# Patient Record
Sex: Male | Born: 1965 | Race: White | Hispanic: No | Smoking: Current every day smoker
Health system: Southern US, Community
[De-identification: ages and names within clinical notes are randomized; demographics above are authoritative.]

## PROBLEM LIST (undated history)

## (undated) DIAGNOSIS — I38 Endocarditis, valve unspecified: Secondary | ICD-10-CM

## (undated) DIAGNOSIS — Z9289 Personal history of other medical treatment: Secondary | ICD-10-CM

## (undated) DIAGNOSIS — I714 Abdominal aortic aneurysm, without rupture, unspecified: Secondary | ICD-10-CM

## (undated) DIAGNOSIS — E119 Type 2 diabetes mellitus without complications: Secondary | ICD-10-CM

## (undated) DIAGNOSIS — J449 Chronic obstructive pulmonary disease, unspecified: Secondary | ICD-10-CM

## (undated) DIAGNOSIS — D649 Anemia, unspecified: Secondary | ICD-10-CM

## (undated) DIAGNOSIS — K259 Gastric ulcer, unspecified as acute or chronic, without hemorrhage or perforation: Secondary | ICD-10-CM

## (undated) DIAGNOSIS — Z5189 Encounter for other specified aftercare: Secondary | ICD-10-CM

## (undated) DIAGNOSIS — K859 Acute pancreatitis without necrosis or infection, unspecified: Secondary | ICD-10-CM

## (undated) DIAGNOSIS — K219 Gastro-esophageal reflux disease without esophagitis: Secondary | ICD-10-CM

## (undated) HISTORY — PX: ABDOMINAL AORTIC ANEURYSM REPAIR: SUR1152

---

## 2002-03-05 ENCOUNTER — Emergency Department (HOSPITAL_COMMUNITY): Admission: EM | Admit: 2002-03-05 | Discharge: 2002-03-05 | Payer: Self-pay | Admitting: Emergency Medicine

## 2004-09-24 ENCOUNTER — Emergency Department: Payer: Self-pay | Admitting: Emergency Medicine

## 2005-12-20 ENCOUNTER — Emergency Department (HOSPITAL_COMMUNITY): Admission: EM | Admit: 2005-12-20 | Discharge: 2005-12-20 | Payer: Self-pay | Admitting: Emergency Medicine

## 2007-12-13 ENCOUNTER — Emergency Department (HOSPITAL_COMMUNITY): Admission: EM | Admit: 2007-12-13 | Discharge: 2007-12-13 | Payer: Self-pay | Admitting: Emergency Medicine

## 2007-12-19 ENCOUNTER — Emergency Department (HOSPITAL_COMMUNITY): Admission: EM | Admit: 2007-12-19 | Discharge: 2007-12-19 | Payer: Self-pay | Admitting: Emergency Medicine

## 2008-02-21 ENCOUNTER — Inpatient Hospital Stay (HOSPITAL_COMMUNITY): Admission: EM | Admit: 2008-02-21 | Discharge: 2008-02-27 | Payer: Self-pay | Admitting: Emergency Medicine

## 2008-02-21 ENCOUNTER — Encounter: Payer: Self-pay | Admitting: Emergency Medicine

## 2008-02-21 ENCOUNTER — Encounter (INDEPENDENT_AMBULATORY_CARE_PROVIDER_SITE_OTHER): Payer: Self-pay | Admitting: Gastroenterology

## 2008-03-07 ENCOUNTER — Emergency Department (HOSPITAL_COMMUNITY): Admission: EM | Admit: 2008-03-07 | Discharge: 2008-03-08 | Payer: Self-pay | Admitting: Emergency Medicine

## 2009-05-01 ENCOUNTER — Emergency Department (HOSPITAL_COMMUNITY): Admission: EM | Admit: 2009-05-01 | Discharge: 2009-05-01 | Payer: Self-pay | Admitting: Emergency Medicine

## 2009-08-29 ENCOUNTER — Ambulatory Visit: Payer: Self-pay | Admitting: Radiology

## 2009-08-29 ENCOUNTER — Emergency Department (HOSPITAL_BASED_OUTPATIENT_CLINIC_OR_DEPARTMENT_OTHER): Admission: EM | Admit: 2009-08-29 | Discharge: 2009-08-29 | Payer: Self-pay | Admitting: Emergency Medicine

## 2009-11-13 ENCOUNTER — Encounter: Payer: Self-pay | Admitting: Emergency Medicine

## 2009-11-13 ENCOUNTER — Inpatient Hospital Stay (HOSPITAL_COMMUNITY): Admission: EM | Admit: 2009-11-13 | Discharge: 2009-11-16 | Payer: Self-pay | Admitting: Internal Medicine

## 2009-11-13 ENCOUNTER — Ambulatory Visit: Payer: Self-pay | Admitting: Diagnostic Radiology

## 2009-12-06 ENCOUNTER — Emergency Department (HOSPITAL_BASED_OUTPATIENT_CLINIC_OR_DEPARTMENT_OTHER): Admission: EM | Admit: 2009-12-06 | Discharge: 2009-12-06 | Payer: Self-pay | Admitting: Emergency Medicine

## 2009-12-06 ENCOUNTER — Ambulatory Visit: Payer: Self-pay | Admitting: Diagnostic Radiology

## 2009-12-07 ENCOUNTER — Ambulatory Visit: Payer: Self-pay | Admitting: Internal Medicine

## 2009-12-07 LAB — CONVERTED CEMR LAB
ALT: <8 units/L (ref 0–53)
AST: 9 units/L (ref 0–37)
Alkaline Phosphatase: 69 units/L (ref 39–117)
BUN: 5 mg/dL — ABNORMAL LOW (ref 6–23)
Basophils Absolute: 0.1 10*3/uL (ref 0.0–0.1)
Calcium: 8.6 mg/dL (ref 8.4–10.5)
Chloride: 95 meq/L — ABNORMAL LOW (ref 96–112)
Creatinine, Ser: 0.62 mg/dL (ref 0.40–1.50)
Eosinophils Absolute: 0.1 10*3/uL (ref 0.0–0.7)
HDL: 28 mg/dL — ABNORMAL LOW (ref >39)
LDL Cholesterol: 38 mg/dL (ref 0–99)
Lymphocytes Relative: 32 % (ref 12–46)
Lymphs Abs: 3.2 10*3/uL (ref 0.7–4.0)
MCV: 88.7 fL (ref 78.0–100.0)
Neutrophils Relative %: 56 % (ref 43–77)
Platelets: 447 10*3/uL — ABNORMAL HIGH (ref 150–400)
RDW: 15.8 % — ABNORMAL HIGH (ref 11.5–15.5)
TSH: 3.52 microintl units/mL (ref 0.350–4.500)
Total CHOL/HDL Ratio: 2.7
VLDL: 9 mg/dL (ref 0–40)
WBC: 10 10*3/uL (ref 4.0–10.5)

## 2009-12-10 HISTORY — PX: SPLENIC ARTERY EMBOLIZATION: SHX2430

## 2009-12-20 ENCOUNTER — Inpatient Hospital Stay (HOSPITAL_COMMUNITY): Admission: EM | Admit: 2009-12-20 | Discharge: 2010-01-06 | Payer: Self-pay | Admitting: Emergency Medicine

## 2009-12-20 ENCOUNTER — Encounter (INDEPENDENT_AMBULATORY_CARE_PROVIDER_SITE_OTHER): Payer: Self-pay | Admitting: Internal Medicine

## 2010-01-25 ENCOUNTER — Ambulatory Visit: Payer: Self-pay | Admitting: Internal Medicine

## 2010-01-26 ENCOUNTER — Encounter: Payer: Self-pay | Admitting: Emergency Medicine

## 2010-01-26 ENCOUNTER — Inpatient Hospital Stay (HOSPITAL_COMMUNITY): Admission: RE | Admit: 2010-01-26 | Discharge: 2010-01-28 | Payer: Self-pay | Admitting: Internal Medicine

## 2010-01-26 ENCOUNTER — Ambulatory Visit: Payer: Self-pay | Admitting: Diagnostic Radiology

## 2010-02-24 ENCOUNTER — Other Ambulatory Visit: Payer: Self-pay | Admitting: Emergency Medicine

## 2010-02-24 ENCOUNTER — Ambulatory Visit: Payer: Self-pay | Admitting: Internal Medicine

## 2010-02-25 ENCOUNTER — Inpatient Hospital Stay (HOSPITAL_COMMUNITY): Admission: EM | Admit: 2010-02-25 | Discharge: 2010-02-26 | Payer: Self-pay | Admitting: Internal Medicine

## 2010-03-28 ENCOUNTER — Ambulatory Visit: Payer: Self-pay | Admitting: Family Medicine

## 2010-03-28 ENCOUNTER — Encounter (INDEPENDENT_AMBULATORY_CARE_PROVIDER_SITE_OTHER): Payer: Self-pay | Admitting: Internal Medicine

## 2010-03-28 LAB — CONVERTED CEMR LAB: Lipase: 122 units/L — ABNORMAL HIGH (ref 0–75)

## 2010-04-10 ENCOUNTER — Emergency Department (HOSPITAL_BASED_OUTPATIENT_CLINIC_OR_DEPARTMENT_OTHER): Admission: EM | Admit: 2010-04-10 | Discharge: 2010-04-10 | Payer: Self-pay | Admitting: Emergency Medicine

## 2010-04-21 ENCOUNTER — Ambulatory Visit: Payer: Self-pay | Admitting: Internal Medicine

## 2010-05-23 ENCOUNTER — Emergency Department (HOSPITAL_BASED_OUTPATIENT_CLINIC_OR_DEPARTMENT_OTHER): Admission: EM | Admit: 2010-05-23 | Discharge: 2010-05-23 | Payer: Self-pay | Admitting: Emergency Medicine

## 2010-05-26 ENCOUNTER — Ambulatory Visit: Payer: Self-pay | Admitting: Family Medicine

## 2010-06-12 ENCOUNTER — Ambulatory Visit: Payer: Self-pay | Admitting: Interventional Radiology

## 2010-06-12 ENCOUNTER — Emergency Department (HOSPITAL_BASED_OUTPATIENT_CLINIC_OR_DEPARTMENT_OTHER): Admission: EM | Admit: 2010-06-12 | Discharge: 2010-06-12 | Payer: Self-pay | Admitting: Emergency Medicine

## 2010-07-22 ENCOUNTER — Emergency Department (HOSPITAL_BASED_OUTPATIENT_CLINIC_OR_DEPARTMENT_OTHER): Admission: EM | Admit: 2010-07-22 | Discharge: 2010-07-22 | Payer: Self-pay | Admitting: Emergency Medicine

## 2010-07-26 ENCOUNTER — Ambulatory Visit: Payer: Self-pay | Admitting: Diagnostic Radiology

## 2010-07-26 ENCOUNTER — Emergency Department (HOSPITAL_BASED_OUTPATIENT_CLINIC_OR_DEPARTMENT_OTHER): Admission: EM | Admit: 2010-07-26 | Discharge: 2010-07-26 | Payer: Self-pay | Admitting: Emergency Medicine

## 2010-08-22 ENCOUNTER — Ambulatory Visit: Payer: Self-pay | Admitting: Internal Medicine

## 2010-08-22 ENCOUNTER — Ambulatory Visit (HOSPITAL_COMMUNITY): Admission: RE | Admit: 2010-08-22 | Discharge: 2010-08-22 | Payer: Self-pay | Admitting: Family Medicine

## 2010-10-25 ENCOUNTER — Encounter (INDEPENDENT_AMBULATORY_CARE_PROVIDER_SITE_OTHER): Payer: Self-pay | Admitting: Internal Medicine

## 2010-11-06 ENCOUNTER — Emergency Department (HOSPITAL_BASED_OUTPATIENT_CLINIC_OR_DEPARTMENT_OTHER): Admission: EM | Admit: 2010-11-06 | Discharge: 2010-11-06 | Payer: Self-pay | Admitting: Emergency Medicine

## 2011-02-20 LAB — COMPREHENSIVE METABOLIC PANEL WITH GFR
ALT: 7 U/L (ref 0–53)
Calcium: 9 mg/dL (ref 8.4–10.5)
Glucose, Bld: 110 mg/dL — ABNORMAL HIGH (ref 70–99)
Sodium: 138 meq/L (ref 135–145)
Total Protein: 7.6 g/dL (ref 6.0–8.3)

## 2011-02-20 LAB — COMPREHENSIVE METABOLIC PANEL
AST: 15 U/L (ref 0–37)
Albumin: 4.3 g/dL (ref 3.5–5.2)
Alkaline Phosphatase: 103 U/L (ref 39–117)
BUN: 9 mg/dL (ref 6–23)
CO2: 25 mEq/L (ref 19–32)
Chloride: 99 mEq/L (ref 96–112)
Creatinine, Ser: 0.6 mg/dL (ref 0.4–1.5)
GFR calc non Af Amer: >60 mL/min (ref >60)
Potassium: 4 mEq/L (ref 3.5–5.1)
Total Bilirubin: 0.9 mg/dL (ref 0.3–1.2)

## 2011-02-20 LAB — DIFFERENTIAL
Basophils Absolute: 0.6 10*3/uL — ABNORMAL HIGH (ref 0.0–0.1)
Basophils Relative: 3 % — ABNORMAL HIGH (ref 0–1)
Eosinophils Absolute: 0.1 10*3/uL (ref 0.0–0.7)
Eosinophils Relative: 0 % (ref 0–5)
Lymphocytes Relative: 18 % (ref 12–46)
Lymphs Abs: 3.6 K/uL (ref 0.7–4.0)
Monocytes Absolute: 1.7 10*3/uL — ABNORMAL HIGH (ref 0.1–1.0)
Monocytes Relative: 8 % (ref 3–12)
Neutro Abs: 14 10*3/uL — ABNORMAL HIGH (ref 1.7–7.7)
Neutrophils Relative %: 71 % (ref 43–77)

## 2011-02-20 LAB — URINALYSIS, ROUTINE W REFLEX MICROSCOPIC
Glucose, UA: NEGATIVE mg/dL
Hgb urine dipstick: NEGATIVE
Specific Gravity, Urine: 1.043 — ABNORMAL HIGH (ref 1.005–1.030)
pH: 5 (ref 5.0–8.0)

## 2011-02-20 LAB — LACTATE DEHYDROGENASE: LDH: 168 U/L (ref 94–250)

## 2011-02-20 LAB — CBC
HCT: 33.4 % — ABNORMAL LOW (ref 39.0–52.0)
Hemoglobin: 11.2 g/dL — ABNORMAL LOW (ref 13.0–17.0)
MCH: 25.9 pg — ABNORMAL LOW (ref 26.0–34.0)
MCHC: 33.6 g/dL (ref 30.0–36.0)
MCV: 77 fL — ABNORMAL LOW (ref 78.0–100.0)
Platelets: 626 K/uL — ABNORMAL HIGH (ref 150–400)
RBC: 4.34 MIL/uL (ref 4.22–5.81)
RDW: 17.4 % — ABNORMAL HIGH (ref 11.5–15.5)
WBC: 20 10*3/uL — ABNORMAL HIGH (ref 4.0–10.5)

## 2011-02-20 LAB — LIPASE, BLOOD: Lipase: 902 U/L — ABNORMAL HIGH (ref 23–300)

## 2011-02-23 LAB — CBC
HCT: 30 % — ABNORMAL LOW (ref 39.0–52.0)
MCHC: 32.6 g/dL (ref 30.0–36.0)
MCV: 83.9 fL (ref 78.0–100.0)
Platelets: 582 10*3/uL — ABNORMAL HIGH (ref 150–400)
RDW: 16.9 % — ABNORMAL HIGH (ref 11.5–15.5)

## 2011-02-23 LAB — URINALYSIS, ROUTINE W REFLEX MICROSCOPIC
Glucose, UA: NEGATIVE mg/dL
Protein, ur: NEGATIVE mg/dL
Specific Gravity, Urine: 1.039 — ABNORMAL HIGH (ref 1.005–1.030)

## 2011-02-23 LAB — DIFFERENTIAL
Basophils Relative: 1 % (ref 0–1)
Lymphocytes Relative: 19 % (ref 12–46)
Lymphs Abs: 1.8 10*3/uL (ref 0.7–4.0)
Monocytes Relative: 4 % (ref 3–12)
Neutro Abs: 7.5 10*3/uL (ref 1.7–7.7)
Neutrophils Relative %: 76 % (ref 43–77)

## 2011-02-23 LAB — COMPREHENSIVE METABOLIC PANEL
AST: 10 U/L (ref 0–37)
Albumin: 3 g/dL — ABNORMAL LOW (ref 3.5–5.2)
BUN: 5 mg/dL — ABNORMAL LOW (ref 6–23)
Calcium: 8.5 mg/dL (ref 8.4–10.5)
Creatinine, Ser: 0.5 mg/dL (ref 0.4–1.5)
Total Protein: 6.4 g/dL (ref 6.0–8.3)

## 2011-02-25 LAB — COMPREHENSIVE METABOLIC PANEL
ALT: 8 U/L (ref 0–53)
ALT: <8 U/L (ref 0–53)
AST: 13 U/L (ref 0–37)
AST: 15 U/L (ref 0–37)
AST: 11 U/L (ref 0–37)
AST: 12 U/L (ref 0–37)
Albumin: 2.2 g/dL — ABNORMAL LOW (ref 3.5–5.2)
Albumin: 3.9 g/dL (ref 3.5–5.2)
Albumin: 2.9 g/dL — ABNORMAL LOW (ref 3.5–5.2)
Albumin: 2.9 g/dL — ABNORMAL LOW (ref 3.5–5.2)
Alkaline Phosphatase: 86 U/L (ref 39–117)
Alkaline Phosphatase: 92 U/L (ref 39–117)
BUN: 1 mg/dL — ABNORMAL LOW (ref 6–23)
BUN: 1 mg/dL — ABNORMAL LOW (ref 6–23)
CO2: 28 mEq/L (ref 19–32)
CO2: 28 mEq/L (ref 19–32)
Calcium: 8 mg/dL — ABNORMAL LOW (ref 8.4–10.5)
Calcium: 8.7 mg/dL (ref 8.4–10.5)
Calcium: 8.8 mg/dL (ref 8.4–10.5)
Chloride: 102 mEq/L (ref 96–112)
Chloride: 93 mEq/L — ABNORMAL LOW (ref 96–112)
Chloride: 95 mEq/L — ABNORMAL LOW (ref 96–112)
Creatinine, Ser: 0.47 mg/dL (ref 0.4–1.5)
Creatinine, Ser: 0.6 mg/dL (ref 0.4–1.5)
Creatinine, Ser: 0.53 mg/dL (ref 0.4–1.5)
Creatinine, Ser: 0.56 mg/dL (ref 0.4–1.5)
GFR calc Af Amer: >60 mL/min (ref >60)
GFR calc Af Amer: >60 mL/min (ref >60)
GFR calc Af Amer: >60 mL/min (ref >60)
GFR calc Af Amer: >60 mL/min (ref >60)
GFR calc non Af Amer: >60 mL/min (ref >60)
GFR calc non Af Amer: >60 mL/min (ref >60)
Glucose, Bld: 82 mg/dL (ref 70–99)
Glucose, Bld: 91 mg/dL (ref 70–99)
Potassium: 4.3 mEq/L (ref 3.5–5.1)
Potassium: 3.6 mEq/L (ref 3.5–5.1)
Potassium: 4.6 mEq/L (ref 3.5–5.1)
Sodium: 130 mEq/L — ABNORMAL LOW (ref 135–145)
Sodium: 133 mEq/L — ABNORMAL LOW (ref 135–145)
Total Bilirubin: 0.8 mg/dL (ref 0.3–1.2)
Total Bilirubin: 0.4 mg/dL (ref 0.3–1.2)
Total Bilirubin: 0.6 mg/dL (ref 0.3–1.2)
Total Protein: 7.6 g/dL (ref 6.0–8.3)
Total Protein: 6 g/dL (ref 6.0–8.3)
Total Protein: 6.2 g/dL (ref 6.0–8.3)

## 2011-02-25 LAB — CBC
HCT: 27.6 % — ABNORMAL LOW (ref 39.0–52.0)
HCT: 27.9 % — ABNORMAL LOW (ref 39.0–52.0)
HCT: 28.3 % — ABNORMAL LOW (ref 39.0–52.0)
HCT: 28.7 % — ABNORMAL LOW (ref 39.0–52.0)
HCT: 29.5 % — ABNORMAL LOW (ref 39.0–52.0)
HCT: 29.8 % — ABNORMAL LOW (ref 39.0–52.0)
Hemoglobin: 9.4 g/dL — ABNORMAL LOW (ref 13.0–17.0)
Hemoglobin: 10.2 g/dL — ABNORMAL LOW (ref 13.0–17.0)
Hemoglobin: 10.4 g/dL — ABNORMAL LOW (ref 13.0–17.0)
Hemoglobin: 9.5 g/dL — ABNORMAL LOW (ref 13.0–17.0)
Hemoglobin: 9.7 g/dL — ABNORMAL LOW (ref 13.0–17.0)
Hemoglobin: 9.7 g/dL — ABNORMAL LOW (ref 13.0–17.0)
MCHC: 33.5 g/dL (ref 30.0–36.0)
MCHC: 33.9 g/dL (ref 30.0–36.0)
MCHC: 32.9 g/dL (ref 30.0–36.0)
MCHC: 33.4 g/dL (ref 30.0–36.0)
MCHC: 33.6 g/dL (ref 30.0–36.0)
MCHC: 33.8 g/dL (ref 30.0–36.0)
MCHC: 34.1 g/dL (ref 30.0–36.0)
MCV: 89.7 fL (ref 78.0–100.0)
MCV: 87.1 fL (ref 78.0–100.0)
MCV: 87.9 fL (ref 78.0–100.0)
MCV: 88 fL (ref 78.0–100.0)
MCV: 88.8 fL (ref 78.0–100.0)
Platelets: 482 10*3/uL — ABNORMAL HIGH (ref 150–400)
Platelets: 574 10*3/uL — ABNORMAL HIGH (ref 150–400)
Platelets: 394 10*3/uL (ref 150–400)
Platelets: 413 10*3/uL — ABNORMAL HIGH (ref 150–400)
Platelets: 429 10*3/uL — ABNORMAL HIGH (ref 150–400)
Platelets: 438 10*3/uL — ABNORMAL HIGH (ref 150–400)
RBC: 3.64 MIL/uL — ABNORMAL LOW (ref 4.22–5.81)
RBC: 4.13 MIL/uL — ABNORMAL LOW (ref 4.22–5.81)
RBC: 3.24 MIL/uL — ABNORMAL LOW (ref 4.22–5.81)
RBC: 3.27 MIL/uL — ABNORMAL LOW (ref 4.22–5.81)
RBC: 3.32 MIL/uL — ABNORMAL LOW (ref 4.22–5.81)
RBC: 3.39 MIL/uL — ABNORMAL LOW (ref 4.22–5.81)
RBC: 3.54 MIL/uL — ABNORMAL LOW (ref 4.22–5.81)
RDW: 16.3 % — ABNORMAL HIGH (ref 11.5–15.5)
RDW: 16.5 % — ABNORMAL HIGH (ref 11.5–15.5)
RDW: 19.6 % — ABNORMAL HIGH (ref 11.5–15.5)
RDW: 15.9 % — ABNORMAL HIGH (ref 11.5–15.5)
RDW: 16.1 % — ABNORMAL HIGH (ref 11.5–15.5)
RDW: 16.4 % — ABNORMAL HIGH (ref 11.5–15.5)
WBC: 11.1 10*3/uL — ABNORMAL HIGH (ref 4.0–10.5)
WBC: 8.1 10*3/uL (ref 4.0–10.5)
WBC: 10.1 10*3/uL (ref 4.0–10.5)
WBC: 7.9 10*3/uL (ref 4.0–10.5)
WBC: 8.3 10*3/uL (ref 4.0–10.5)
WBC: 8.3 10*3/uL (ref 4.0–10.5)
WBC: 8.5 10*3/uL (ref 4.0–10.5)
WBC: 9.6 10*3/uL (ref 4.0–10.5)

## 2011-02-25 LAB — BASIC METABOLIC PANEL
BUN: 1 mg/dL — ABNORMAL LOW (ref 6–23)
BUN: <1 mg/dL — ABNORMAL LOW (ref 6–23)
BUN: 2 mg/dL — ABNORMAL LOW (ref 6–23)
BUN: 2 mg/dL — ABNORMAL LOW (ref 6–23)
BUN: <1 mg/dL — ABNORMAL LOW (ref 6–23)
BUN: <1 mg/dL — ABNORMAL LOW (ref 6–23)
CO2: 27 mEq/L (ref 19–32)
CO2: 29 mEq/L (ref 19–32)
CO2: 30 mEq/L (ref 19–32)
CO2: 31 mEq/L (ref 19–32)
CO2: 32 mEq/L (ref 19–32)
Calcium: 8.2 mg/dL — ABNORMAL LOW (ref 8.4–10.5)
Calcium: 8.7 mg/dL (ref 8.4–10.5)
Calcium: 8.8 mg/dL (ref 8.4–10.5)
Calcium: 9 mg/dL (ref 8.4–10.5)
Calcium: 9.1 mg/dL (ref 8.4–10.5)
Calcium: 9.1 mg/dL (ref 8.4–10.5)
Chloride: 103 mEq/L (ref 96–112)
Chloride: 99 mEq/L (ref 96–112)
Chloride: 96 mEq/L (ref 96–112)
Chloride: 96 mEq/L (ref 96–112)
Chloride: 98 mEq/L (ref 96–112)
Chloride: 99 mEq/L (ref 96–112)
Creatinine, Ser: 0.47 mg/dL (ref 0.4–1.5)
Creatinine, Ser: 0.5 mg/dL (ref 0.4–1.5)
Creatinine, Ser: 0.53 mg/dL (ref 0.4–1.5)
Creatinine, Ser: 0.63 mg/dL (ref 0.4–1.5)
Creatinine, Ser: 0.68 mg/dL (ref 0.4–1.5)
GFR calc Af Amer: >60 mL/min (ref >60)
GFR calc Af Amer: >60 mL/min (ref >60)
GFR calc Af Amer: >60 mL/min (ref >60)
GFR calc Af Amer: >60 mL/min (ref >60)
GFR calc non Af Amer: >60 mL/min (ref >60)
GFR calc non Af Amer: >60 mL/min (ref >60)
GFR calc non Af Amer: >60 mL/min (ref >60)
GFR calc non Af Amer: >60 mL/min (ref >60)
GFR calc non Af Amer: >60 mL/min (ref >60)
GFR calc non Af Amer: >60 mL/min (ref >60)
Glucose, Bld: 79 mg/dL (ref 70–99)
Glucose, Bld: 88 mg/dL (ref 70–99)
Glucose, Bld: 91 mg/dL (ref 70–99)
Glucose, Bld: 75 mg/dL (ref 70–99)
Glucose, Bld: 87 mg/dL (ref 70–99)
Glucose, Bld: 93 mg/dL (ref 70–99)
Potassium: 3.5 mEq/L (ref 3.5–5.1)
Potassium: 3.7 mEq/L (ref 3.5–5.1)
Potassium: 4 mEq/L (ref 3.5–5.1)
Potassium: 4.3 mEq/L (ref 3.5–5.1)
Potassium: 4.6 mEq/L (ref 3.5–5.1)
Sodium: 134 mEq/L — ABNORMAL LOW (ref 135–145)
Sodium: 135 mEq/L (ref 135–145)
Sodium: 136 mEq/L (ref 135–145)
Sodium: 137 mEq/L (ref 135–145)
Sodium: 137 mEq/L (ref 135–145)

## 2011-02-25 LAB — HEMOGLOBIN AND HEMATOCRIT, BLOOD
HCT: 26.9 % — ABNORMAL LOW (ref 39.0–52.0)
HCT: 27.1 % — ABNORMAL LOW (ref 39.0–52.0)
HCT: 27.7 % — ABNORMAL LOW (ref 39.0–52.0)
HCT: 28.2 % — ABNORMAL LOW (ref 39.0–52.0)
HCT: 30.4 % — ABNORMAL LOW (ref 39.0–52.0)
HCT: 31 % — ABNORMAL LOW (ref 39.0–52.0)
HCT: 30.1 % — ABNORMAL LOW (ref 39.0–52.0)
HCT: 30.6 % — ABNORMAL LOW (ref 39.0–52.0)
HCT: 30.8 % — ABNORMAL LOW (ref 39.0–52.0)
HCT: 31.2 % — ABNORMAL LOW (ref 39.0–52.0)
HCT: 31.7 % — ABNORMAL LOW (ref 39.0–52.0)
Hemoglobin: 8.7 g/dL — ABNORMAL LOW (ref 13.0–17.0)
Hemoglobin: 8.8 g/dL — ABNORMAL LOW (ref 13.0–17.0)
Hemoglobin: 9 g/dL — ABNORMAL LOW (ref 13.0–17.0)
Hemoglobin: 9.3 g/dL — ABNORMAL LOW (ref 13.0–17.0)
Hemoglobin: 9.5 g/dL — ABNORMAL LOW (ref 13.0–17.0)
Hemoglobin: 10.1 g/dL — ABNORMAL LOW (ref 13.0–17.0)
Hemoglobin: 10.2 g/dL — ABNORMAL LOW (ref 13.0–17.0)
Hemoglobin: 10.4 g/dL — ABNORMAL LOW (ref 13.0–17.0)
Hemoglobin: 10.4 g/dL — ABNORMAL LOW (ref 13.0–17.0)
Hemoglobin: 10.4 g/dL — ABNORMAL LOW (ref 13.0–17.0)

## 2011-02-25 LAB — LIPASE, BLOOD
Lipase: 128 U/L — ABNORMAL HIGH (ref 11–59)
Lipase: 84 U/L — ABNORMAL HIGH (ref 11–59)
Lipase: 134 U/L — ABNORMAL HIGH (ref 11–59)
Lipase: 21 U/L (ref 11–59)
Lipase: 248 U/L — ABNORMAL HIGH (ref 11–59)
Lipase: 93 U/L — ABNORMAL HIGH (ref 11–59)

## 2011-02-25 LAB — GLUCOSE, CAPILLARY
Glucose-Capillary: 108 mg/dL — ABNORMAL HIGH (ref 70–99)
Glucose-Capillary: 108 mg/dL — ABNORMAL HIGH (ref 70–99)
Glucose-Capillary: 127 mg/dL — ABNORMAL HIGH (ref 70–99)
Glucose-Capillary: 161 mg/dL — ABNORMAL HIGH (ref 70–99)

## 2011-02-25 LAB — DIFFERENTIAL
Basophils Absolute: 0.1 10*3/uL (ref 0.0–0.1)
Basophils Absolute: 0.1 10*3/uL (ref 0.0–0.1)
Basophils Relative: 1 % (ref 0–1)
Eosinophils Absolute: 0.2 10*3/uL (ref 0.0–0.7)
Eosinophils Relative: 0 % (ref 0–5)
Eosinophils Relative: 1 % (ref 0–5)
Eosinophils Relative: 2 % (ref 0–5)
Lymphocytes Relative: 22 % (ref 12–46)
Lymphocytes Relative: 30 % (ref 12–46)
Lymphs Abs: 1.9 10*3/uL (ref 0.7–4.0)
Monocytes Absolute: 0.6 10*3/uL (ref 0.1–1.0)
Monocytes Absolute: 0.8 10*3/uL (ref 0.1–1.0)
Monocytes Absolute: 1.1 10*3/uL — ABNORMAL HIGH (ref 0.1–1.0)
Monocytes Relative: 7 % (ref 3–12)
Monocytes Relative: 8 % (ref 3–12)
Neutro Abs: 7.7 10*3/uL (ref 1.7–7.7)

## 2011-02-25 LAB — HEPATIC FUNCTION PANEL
AST: 10 U/L (ref 0–37)
Bilirubin, Direct: 0.2 mg/dL (ref 0.0–0.3)
Indirect Bilirubin: 0.1 mg/dL — ABNORMAL LOW (ref 0.3–0.9)
Total Bilirubin: 0.3 mg/dL (ref 0.3–1.2)

## 2011-02-25 LAB — POCT I-STAT, CHEM 8
Calcium, Ion: 1.07 mmol/L — ABNORMAL LOW (ref 1.12–1.32)
Chloride: 97 mEq/L (ref 96–112)
Glucose, Bld: 104 mg/dL — ABNORMAL HIGH (ref 70–99)
HCT: 36 % — ABNORMAL LOW (ref 39.0–52.0)
Hemoglobin: 12.2 g/dL — ABNORMAL LOW (ref 13.0–17.0)
TCO2: 26 mmol/L (ref 0–100)

## 2011-02-25 LAB — URINALYSIS, ROUTINE W REFLEX MICROSCOPIC
Bilirubin Urine: NEGATIVE
Glucose, UA: NEGATIVE mg/dL
Hgb urine dipstick: NEGATIVE
Specific Gravity, Urine: 1.025 (ref 1.005–1.030)
Urobilinogen, UA: 0.2 mg/dL (ref 0.0–1.0)
pH: 5 (ref 5.0–8.0)

## 2011-02-25 LAB — LACTATE DEHYDROGENASE: LDH: 79 U/L — ABNORMAL LOW (ref 94–250)

## 2011-02-25 LAB — SAMPLE TO BLOOD BANK

## 2011-02-25 LAB — FERRITIN: Ferritin: 38 ng/mL (ref 22–322)

## 2011-02-25 LAB — AMYLASE: Amylase: 302 U/L — ABNORMAL HIGH (ref 0–105)

## 2011-02-25 LAB — TYPE AND SCREEN: Antibody Screen: NEGATIVE

## 2011-02-26 LAB — COMPREHENSIVE METABOLIC PANEL
AST: 69 U/L — ABNORMAL HIGH (ref 0–37)
Albumin: 4 g/dL (ref 3.5–5.2)
BUN: 9 mg/dL (ref 6–23)
Calcium: 9 mg/dL (ref 8.4–10.5)
Creatinine, Ser: 0.6 mg/dL (ref 0.4–1.5)
GFR calc Af Amer: >60 mL/min (ref >60)
GFR calc non Af Amer: >60 mL/min (ref >60)
Total Bilirubin: 1.1 mg/dL (ref 0.3–1.2)

## 2011-02-26 LAB — DIFFERENTIAL
Basophils Absolute: 0.6 10*3/uL — ABNORMAL HIGH (ref 0.0–0.1)
Eosinophils Relative: 0 % (ref 0–5)
Lymphocytes Relative: 11 % — ABNORMAL LOW (ref 12–46)
Lymphs Abs: 1.4 10*3/uL (ref 0.7–4.0)
Monocytes Absolute: 0.8 10*3/uL (ref 0.1–1.0)
Neutro Abs: 10.9 10*3/uL — ABNORMAL HIGH (ref 1.7–7.7)

## 2011-02-26 LAB — CBC
HCT: 36.9 % — ABNORMAL LOW (ref 39.0–52.0)
MCHC: 32.6 g/dL (ref 30.0–36.0)
MCV: 81.4 fL (ref 78.0–100.0)
Platelets: 514 10*3/uL — ABNORMAL HIGH (ref 150–400)

## 2011-02-26 LAB — URINALYSIS, ROUTINE W REFLEX MICROSCOPIC
Bilirubin Urine: NEGATIVE
Nitrite: NEGATIVE
Specific Gravity, Urine: 1.031 — ABNORMAL HIGH (ref 1.005–1.030)
Urobilinogen, UA: 0.2 mg/dL (ref 0.0–1.0)
pH: 5.5 (ref 5.0–8.0)

## 2011-02-26 LAB — LIPASE, BLOOD: Lipase: 2434 U/L — ABNORMAL HIGH (ref 23–300)

## 2011-02-27 LAB — CBC
Hemoglobin: 11.4 g/dL — ABNORMAL LOW (ref 13.0–17.0)
RBC: 4.1 MIL/uL — ABNORMAL LOW (ref 4.22–5.81)
WBC: 15.1 10*3/uL — ABNORMAL HIGH (ref 4.0–10.5)

## 2011-02-27 LAB — COMPREHENSIVE METABOLIC PANEL
ALT: 5 U/L (ref 0–53)
AST: 13 U/L (ref 0–37)
Alkaline Phosphatase: 120 U/L — ABNORMAL HIGH (ref 39–117)
CO2: 26 mEq/L (ref 19–32)
Chloride: 99 mEq/L (ref 96–112)
GFR calc Af Amer: >60 mL/min (ref >60)
GFR calc non Af Amer: >60 mL/min (ref >60)
Glucose, Bld: 120 mg/dL — ABNORMAL HIGH (ref 70–99)
Potassium: 4.1 mEq/L (ref 3.5–5.1)
Sodium: 136 mEq/L (ref 135–145)
Total Bilirubin: 0.5 mg/dL (ref 0.3–1.2)

## 2011-02-27 LAB — LIPASE, BLOOD: Lipase: 1097 U/L — ABNORMAL HIGH (ref 23–300)

## 2011-02-28 LAB — COMPREHENSIVE METABOLIC PANEL
ALT: 8 U/L (ref 0–53)
AST: 19 U/L (ref 0–37)
Albumin: 4 g/dL (ref 3.5–5.2)
Alkaline Phosphatase: 67 U/L (ref 39–117)
Alkaline Phosphatase: 94 U/L (ref 39–117)
BUN: 1 mg/dL — ABNORMAL LOW (ref 6–23)
BUN: 9 mg/dL (ref 6–23)
Calcium: 7.9 mg/dL — ABNORMAL LOW (ref 8.4–10.5)
Chloride: 103 mEq/L (ref 96–112)
GFR calc Af Amer: >60 mL/min (ref >60)
Glucose, Bld: 88 mg/dL (ref 70–99)
Potassium: 4.5 mEq/L (ref 3.5–5.1)
Total Bilirubin: 0.5 mg/dL (ref 0.3–1.2)
Total Protein: 4.8 g/dL — ABNORMAL LOW (ref 6.0–8.3)

## 2011-02-28 LAB — GLUCOSE, CAPILLARY
Glucose-Capillary: 168 mg/dL — ABNORMAL HIGH (ref 70–99)
Glucose-Capillary: 73 mg/dL (ref 70–99)
Glucose-Capillary: 85 mg/dL (ref 70–99)

## 2011-02-28 LAB — URINE CULTURE: Culture: NO GROWTH

## 2011-02-28 LAB — CBC
HCT: 26.6 % — ABNORMAL LOW (ref 39.0–52.0)
HCT: 29.1 % — ABNORMAL LOW (ref 39.0–52.0)
HCT: 32.9 % — ABNORMAL LOW (ref 39.0–52.0)
Hemoglobin: 8.9 g/dL — ABNORMAL LOW (ref 13.0–17.0)
MCHC: 33.3 g/dL (ref 30.0–36.0)
MCHC: 33.3 g/dL (ref 30.0–36.0)
MCV: 86.4 fL (ref 78.0–100.0)
MCV: 88.2 fL (ref 78.0–100.0)
Platelets: 424 10*3/uL — ABNORMAL HIGH (ref 150–400)
Platelets: 534 10*3/uL — ABNORMAL HIGH (ref 150–400)
RDW: 18 % — ABNORMAL HIGH (ref 11.5–15.5)
WBC: 10 10*3/uL (ref 4.0–10.5)
WBC: 19.3 10*3/uL — ABNORMAL HIGH (ref 4.0–10.5)

## 2011-02-28 LAB — URINE DRUGS OF ABUSE SCREEN W ALC, ROUTINE (REF LAB)
Benzodiazepines.: NEGATIVE
Creatinine,U: 58.4 mg/dL
Ethyl Alcohol: <10 mg/dL (ref <10)
Marijuana Metabolite: NEGATIVE
Opiate Screen, Urine: NEGATIVE
Phencyclidine (PCP): NEGATIVE

## 2011-02-28 LAB — DIFFERENTIAL
Basophils Absolute: 0.1 10*3/uL (ref 0.0–0.1)
Basophils Relative: 1 % (ref 0–1)
Eosinophils Absolute: 0.1 10*3/uL (ref 0.0–0.7)
Eosinophils Relative: 0 % (ref 0–5)
Monocytes Absolute: 1.6 10*3/uL — ABNORMAL HIGH (ref 0.1–1.0)

## 2011-02-28 LAB — CULTURE, BLOOD (ROUTINE X 2)

## 2011-02-28 LAB — URINALYSIS, ROUTINE W REFLEX MICROSCOPIC
Bilirubin Urine: NEGATIVE
Ketones, ur: NEGATIVE mg/dL
Nitrite: NEGATIVE
Urobilinogen, UA: 0.2 mg/dL (ref 0.0–1.0)

## 2011-02-28 LAB — LIPASE, BLOOD: Lipase: 68 U/L — ABNORMAL HIGH (ref 11–59)

## 2011-03-04 LAB — CBC
HCT: 27.1 % — ABNORMAL LOW (ref 39.0–52.0)
Hemoglobin: 9 g/dL — ABNORMAL LOW (ref 13.0–17.0)
MCHC: 33.1 g/dL (ref 30.0–36.0)
MCV: 85.2 fL (ref 78.0–100.0)
Platelets: 287 10*3/uL (ref 150–400)
RBC: 3.18 MIL/uL — ABNORMAL LOW (ref 4.22–5.81)
RDW: 17.3 % — ABNORMAL HIGH (ref 11.5–15.5)
WBC: 9.5 10*3/uL (ref 4.0–10.5)

## 2011-03-04 LAB — DIFFERENTIAL
Basophils Absolute: 0 10*3/uL (ref 0.0–0.1)
Basophils Relative: 1 % (ref 0–1)
Eosinophils Absolute: 0 10*3/uL (ref 0.0–0.7)
Eosinophils Relative: 1 % (ref 0–5)
Lymphocytes Relative: 28 % (ref 12–46)
Lymphs Abs: 2.6 10*3/uL (ref 0.7–4.0)
Monocytes Absolute: 0.9 10*3/uL (ref 0.1–1.0)
Monocytes Relative: 9 % (ref 3–12)
Neutro Abs: 5.9 10*3/uL (ref 1.7–7.7)
Neutrophils Relative %: 62 % (ref 43–77)

## 2011-03-04 LAB — COMPREHENSIVE METABOLIC PANEL
Albumin: 2.8 g/dL — ABNORMAL LOW (ref 3.5–5.2)
BUN: 7 mg/dL (ref 6–23)
Chloride: 102 mEq/L (ref 96–112)
Creatinine, Ser: 0.52 mg/dL (ref 0.4–1.5)
Glucose, Bld: 86 mg/dL (ref 70–99)
Total Bilirubin: 0.5 mg/dL (ref 0.3–1.2)
Total Protein: 5.5 g/dL — ABNORMAL LOW (ref 6.0–8.3)

## 2011-03-04 LAB — LIPASE, BLOOD
Lipase: 358 U/L — ABNORMAL HIGH (ref 11–59)
Lipase: 506 U/L — ABNORMAL HIGH (ref 11–59)

## 2011-03-04 LAB — AMYLASE: Amylase: 495 U/L — ABNORMAL HIGH (ref 0–105)

## 2011-03-05 LAB — COMPREHENSIVE METABOLIC PANEL
ALT: 7 U/L (ref 0–53)
AST: 16 U/L (ref 0–37)
Albumin: 3.7 g/dL (ref 3.5–5.2)
Alkaline Phosphatase: 89 U/L (ref 39–117)
BUN: 8 mg/dL (ref 6–23)
Chloride: 105 mEq/L (ref 96–112)
GFR calc Af Amer: >60 mL/min (ref >60)
Potassium: 3.4 mEq/L — ABNORMAL LOW (ref 3.5–5.1)
Sodium: 140 mEq/L (ref 135–145)
Total Bilirubin: 0.4 mg/dL (ref 0.3–1.2)
Total Protein: 7.1 g/dL (ref 6.0–8.3)

## 2011-03-05 LAB — DIFFERENTIAL
Basophils Absolute: 0.1 10*3/uL (ref 0.0–0.1)
Basophils Relative: 1 % (ref 0–1)
Eosinophils Absolute: 0 10*3/uL (ref 0.0–0.7)
Eosinophils Relative: 0 % (ref 0–5)
Monocytes Absolute: 1 10*3/uL (ref 0.1–1.0)
Monocytes Relative: 9 % (ref 3–12)

## 2011-03-05 LAB — CBC
HCT: 30.2 % — ABNORMAL LOW (ref 39.0–52.0)
Platelets: 355 10*3/uL (ref 150–400)
RDW: 15.9 % — ABNORMAL HIGH (ref 11.5–15.5)
WBC: 12.1 10*3/uL — ABNORMAL HIGH (ref 4.0–10.5)

## 2011-03-05 LAB — URINALYSIS, ROUTINE W REFLEX MICROSCOPIC
Bilirubin Urine: NEGATIVE
Glucose, UA: NEGATIVE mg/dL
Ketones, ur: 15 mg/dL — AB
Nitrite: NEGATIVE
Specific Gravity, Urine: 1.035 — ABNORMAL HIGH (ref 1.005–1.030)
pH: 5.5 (ref 5.0–8.0)

## 2011-03-12 LAB — LIPASE, BLOOD: Lipase: 2034 U/L — ABNORMAL HIGH (ref 11–59)

## 2011-03-12 LAB — CBC
HCT: 34.5 % — ABNORMAL LOW (ref 39.0–52.0)
Hemoglobin: 11.8 g/dL — ABNORMAL LOW (ref 13.0–17.0)
MCHC: 34.3 g/dL (ref 30.0–36.0)
MCV: 89.9 fL (ref 78.0–100.0)
Platelets: 401 K/uL — ABNORMAL HIGH (ref 150–400)
RBC: 3.84 MIL/uL — ABNORMAL LOW (ref 4.22–5.81)
RDW: 15.5 % (ref 11.5–15.5)
WBC: 14 K/uL — ABNORMAL HIGH (ref 4.0–10.5)

## 2011-03-12 LAB — COMPREHENSIVE METABOLIC PANEL
ALT: 13 U/L (ref 0–53)
Alkaline Phosphatase: 87 U/L (ref 39–117)
BUN: 9 mg/dL (ref 6–23)
CO2: 26 mEq/L (ref 19–32)
GFR calc non Af Amer: >60 mL/min (ref >60)
Glucose, Bld: 97 mg/dL (ref 70–99)
Potassium: 4.5 mEq/L (ref 3.5–5.1)
Sodium: 136 mEq/L (ref 135–145)
Total Protein: 6.8 g/dL (ref 6.0–8.3)

## 2011-03-12 LAB — COMPREHENSIVE METABOLIC PANEL WITH GFR
AST: 12 U/L (ref 0–37)
Albumin: 3.7 g/dL (ref 3.5–5.2)
Calcium: 9 mg/dL (ref 8.4–10.5)
Chloride: 99 meq/L (ref 96–112)
Creatinine, Ser: 0.6 mg/dL (ref 0.4–1.5)
GFR calc Af Amer: >60 mL/min (ref >60)
Total Bilirubin: 0.4 mg/dL (ref 0.3–1.2)

## 2011-03-12 LAB — DIFFERENTIAL
Basophils Absolute: 0.2 10*3/uL — ABNORMAL HIGH (ref 0.0–0.1)
Basophils Relative: 1 % (ref 0–1)
Eosinophils Absolute: 0.2 10*3/uL (ref 0.0–0.7)
Eosinophils Relative: 1 % (ref 0–5)
Lymphocytes Relative: 19 % (ref 12–46)
Lymphs Abs: 2.7 K/uL (ref 0.7–4.0)
Monocytes Absolute: 0.9 K/uL (ref 0.1–1.0)
Monocytes Relative: 6 % (ref 3–12)
Neutro Abs: 10 10*3/uL — ABNORMAL HIGH (ref 1.7–7.7)
Neutrophils Relative %: 72 % (ref 43–77)

## 2011-03-12 LAB — PROTIME-INR
INR: 1.04 (ref 0.00–1.49)
Prothrombin Time: 13.5 seconds (ref 11.6–15.2)

## 2011-03-12 LAB — APTT: aPTT: 30 s (ref 24–37)

## 2011-03-13 LAB — CBC
HCT: 29.3 % — ABNORMAL LOW (ref 39.0–52.0)
Hemoglobin: 9.1 g/dL — ABNORMAL LOW (ref 13.0–17.0)
Hemoglobin: 9.5 g/dL — ABNORMAL LOW (ref 13.0–17.0)
Hemoglobin: 12 g/dL — ABNORMAL LOW (ref 13.0–17.0)
MCHC: 32.6 g/dL (ref 30.0–36.0)
MCHC: 33.1 g/dL (ref 30.0–36.0)
MCV: 91.7 fL (ref 78.0–100.0)
MCV: 91.8 fL (ref 78.0–100.0)
Platelets: 553 10*3/uL — ABNORMAL HIGH (ref 150–400)
RBC: 3.06 MIL/uL — ABNORMAL LOW (ref 4.22–5.81)
RBC: 3.19 MIL/uL — ABNORMAL LOW (ref 4.22–5.81)
RDW: 15.2 % (ref 11.5–15.5)
RDW: 15.4 % (ref 11.5–15.5)
RDW: 15.5 % (ref 11.5–15.5)
RDW: 14.9 % (ref 11.5–15.5)
WBC: 9.5 10*3/uL (ref 4.0–10.5)

## 2011-03-13 LAB — RETICULOCYTES
RBC.: 3.17 MIL/uL — ABNORMAL LOW (ref 4.22–5.81)
Retic Ct Pct: 1.2 % (ref 0.4–3.1)

## 2011-03-13 LAB — COMPREHENSIVE METABOLIC PANEL
ALT: <6 U/L (ref 0–53)
AST: 11 U/L (ref 0–37)
AST: 11 U/L (ref 0–37)
AST: 43 U/L — ABNORMAL HIGH (ref 0–37)
Albumin: 2.7 g/dL — ABNORMAL LOW (ref 3.5–5.2)
Albumin: 4.2 g/dL (ref 3.5–5.2)
Alkaline Phosphatase: 90 U/L (ref 39–117)
CO2: 22 mEq/L (ref 19–32)
CO2: 23 mEq/L (ref 19–32)
Calcium: 8 mg/dL — ABNORMAL LOW (ref 8.4–10.5)
Chloride: 100 mEq/L (ref 96–112)
Creatinine, Ser: 0.47 mg/dL (ref 0.4–1.5)
Creatinine, Ser: 0.62 mg/dL (ref 0.4–1.5)
GFR calc Af Amer: >60 mL/min (ref >60)
GFR calc Af Amer: >60 mL/min (ref >60)
GFR calc Af Amer: >60 mL/min (ref >60)
GFR calc non Af Amer: >60 mL/min (ref >60)
GFR calc non Af Amer: >60 mL/min (ref >60)
Glucose, Bld: 87 mg/dL (ref 70–99)
Glucose, Bld: 95 mg/dL (ref 70–99)
Potassium: 5.6 mEq/L — ABNORMAL HIGH (ref 3.5–5.1)
Sodium: 131 mEq/L — ABNORMAL LOW (ref 135–145)
Sodium: 135 mEq/L (ref 135–145)
Total Bilirubin: 0.7 mg/dL (ref 0.3–1.2)
Total Protein: 5.9 g/dL — ABNORMAL LOW (ref 6.0–8.3)
Total Protein: 8.2 g/dL (ref 6.0–8.3)

## 2011-03-13 LAB — LIPASE, BLOOD
Lipase: 26 U/L (ref 11–59)
Lipase: 36 U/L (ref 11–59)

## 2011-03-13 LAB — MAGNESIUM
Magnesium: 1.8 mg/dL (ref 1.5–2.5)
Magnesium: 1.8 mg/dL (ref 1.5–2.5)
Magnesium: 2 mg/dL (ref 1.5–2.5)

## 2011-03-13 LAB — DIFFERENTIAL
Basophils Absolute: 0 10*3/uL (ref 0.0–0.1)
Basophils Absolute: 0.1 10*3/uL (ref 0.0–0.1)
Basophils Relative: 1 % (ref 0–1)
Basophils Relative: 3 % — ABNORMAL HIGH (ref 0–1)
Eosinophils Absolute: 0 10*3/uL (ref 0.0–0.7)
Eosinophils Absolute: 0.1 10*3/uL (ref 0.0–0.7)
Eosinophils Absolute: 0.1 10*3/uL (ref 0.0–0.7)
Eosinophils Relative: 1 % (ref 0–5)
Eosinophils Relative: 1 % (ref 0–5)
Lymphocytes Relative: 17 % (ref 12–46)
Lymphs Abs: 3.7 10*3/uL (ref 0.7–4.0)
Monocytes Absolute: 1.5 10*3/uL — ABNORMAL HIGH (ref 0.1–1.0)
Monocytes Relative: 11 % (ref 3–12)
Neutro Abs: 7.9 10*3/uL — ABNORMAL HIGH (ref 1.7–7.7)
Neutrophils Relative %: 71 % (ref 43–77)
Neutrophils Relative %: 59 % (ref 43–77)

## 2011-03-13 LAB — BASIC METABOLIC PANEL
CO2: 26 mEq/L (ref 19–32)
Calcium: 7.8 mg/dL — ABNORMAL LOW (ref 8.4–10.5)
Calcium: 8.2 mg/dL — ABNORMAL LOW (ref 8.4–10.5)
Creatinine, Ser: 0.55 mg/dL (ref 0.4–1.5)
GFR calc Af Amer: >60 mL/min (ref >60)
GFR calc non Af Amer: >60 mL/min (ref >60)
Glucose, Bld: 110 mg/dL — ABNORMAL HIGH (ref 70–99)
Glucose, Bld: 80 mg/dL (ref 70–99)
Sodium: 134 mEq/L — ABNORMAL LOW (ref 135–145)

## 2011-03-13 LAB — PHOSPHORUS: Phosphorus: 3.8 mg/dL (ref 2.3–4.6)

## 2011-03-13 LAB — VITAMIN B12: Vitamin B-12: 657 pg/mL (ref 211–911)

## 2011-03-13 LAB — OCCULT BLOOD (STOOL CUP TO LAB): Fecal Occult Bld: NEGATIVE

## 2011-03-13 LAB — LIPID PANEL: HDL: 24 mg/dL — ABNORMAL LOW (ref >39)

## 2011-03-16 LAB — URINALYSIS, ROUTINE W REFLEX MICROSCOPIC
Glucose, UA: NEGATIVE mg/dL
Hgb urine dipstick: NEGATIVE
Ketones, ur: 15 mg/dL — AB
Protein, ur: NEGATIVE mg/dL
pH: 5.5 (ref 5.0–8.0)

## 2011-03-16 LAB — DIFFERENTIAL
Basophils Absolute: 0.2 10*3/uL — ABNORMAL HIGH (ref 0.0–0.1)
Basophils Relative: 2 % — ABNORMAL HIGH (ref 0–1)
Eosinophils Absolute: 0.1 10*3/uL (ref 0.0–0.7)
Monocytes Relative: 7 % (ref 3–12)
Neutrophils Relative %: 73 % (ref 43–77)

## 2011-03-16 LAB — CBC
HCT: 39.6 % (ref 39.0–52.0)
Hemoglobin: 13.3 g/dL (ref 13.0–17.0)
RBC: 4.15 MIL/uL — ABNORMAL LOW (ref 4.22–5.81)
WBC: 10.4 10*3/uL (ref 4.0–10.5)

## 2011-03-16 LAB — COMPREHENSIVE METABOLIC PANEL
ALT: <6 U/L (ref 0–53)
Alkaline Phosphatase: 100 U/L (ref 39–117)
BUN: 7 mg/dL (ref 6–23)
CO2: 25 mEq/L (ref 19–32)
Chloride: 99 mEq/L (ref 96–112)
Glucose, Bld: 80 mg/dL (ref 70–99)
Potassium: 3.9 mEq/L (ref 3.5–5.1)
Sodium: 136 mEq/L (ref 135–145)
Total Bilirubin: 0.2 mg/dL — ABNORMAL LOW (ref 0.3–1.2)
Total Protein: 7.6 g/dL (ref 6.0–8.3)

## 2011-03-16 LAB — HEMOCCULT GUIAC POC 1CARD (OFFICE): Fecal Occult Bld: NEGATIVE

## 2011-03-20 LAB — SAMPLE TO BLOOD BANK

## 2011-03-20 LAB — CBC
HCT: 36.2 % — ABNORMAL LOW (ref 39.0–52.0)
MCV: 95.7 fL (ref 78.0–100.0)
Platelets: 419 10*3/uL — ABNORMAL HIGH (ref 150–400)
RDW: 14.5 % (ref 11.5–15.5)

## 2011-03-20 LAB — COMPREHENSIVE METABOLIC PANEL
AST: 18 U/L (ref 0–37)
Albumin: 3.3 g/dL — ABNORMAL LOW (ref 3.5–5.2)
BUN: 9 mg/dL (ref 6–23)
Creatinine, Ser: 0.79 mg/dL (ref 0.4–1.5)
GFR calc Af Amer: >60 mL/min (ref >60)
Potassium: 3.7 mEq/L (ref 3.5–5.1)
Total Protein: 7.1 g/dL (ref 6.0–8.3)

## 2011-03-20 LAB — ETHANOL: Alcohol, Ethyl (B): <5 mg/dL (ref 0–10)

## 2011-03-20 LAB — DIFFERENTIAL
Lymphocytes Relative: 26 % (ref 12–46)
Monocytes Absolute: 0.9 10*3/uL (ref 0.1–1.0)
Monocytes Relative: 9 % (ref 3–12)
Neutro Abs: 6.5 10*3/uL (ref 1.7–7.7)

## 2011-04-24 NOTE — H&P (Signed)
Rickey Martin, Rickey Martin NO.:  [id number]   MEDICAL RECORD NO.:  [id number]          PATIENT TYPE:  INP   LOCATION:  2304                         FACILITY:  [facility]   PHYSICIAN:  Bernette Redbird, M.D.   DATE OF BIRTH:  Jan 08, 1966   DATE OF ADMISSION:  02/21/2008  DATE OF DISCHARGE:                              HISTORY & PHYSICAL   This 45 year old male is being admitted to the hospital because of GI  bleeding and syncope.   HISTORY:  Rickey Martin is a heavy smoker, heavy drinker and significant user  of NSAIDs with no prior history of GI bleeding.  Approximately 36 hours  prior to admission, Rickey Martin had coffee-ground emesis and since that time, has  had multiple melenic stools, developing syncope at home approximately  six hours prior to admission.  Rickey Martin may have had some sort of seizure  activity without incontinence or tongue biting and was brought in a  hypotensive state by EMS to the emergency room where I was asked to  admit him as an unassigned patient.   In the emergency room, prehydration hemoglobin was 8.1.  Rickey Martin had a large  melenic stool after arriving at the emergency room and had an initial  blood pressure of 70 systolic.   The patient has been using significant amounts of Advil and occasional  BCs.  Rickey Martin estimates perhaps 50 doses of Advil in the past month or so.  Rickey Martin smokes at least two packs per day and drinks probably six to eight  beers a day.  No prior hospitalizations.   PAST MEDICAL HISTORY:   ALLERGIES:  No known allergies.   OUTPATIENT MEDICATIONS:  Albuterol inhaler or Primatene Mist.  Also  Advil as noted and occasional Rolaids.   OPERATIONS:  None.   CHRONIC MEDICAL ILLNESSES:  Rickey Martin has been seen in the ER and diagnosed  with COPD and was started on an inhaler.  Rickey Martin has not had extensive  pulmonary evaluation to my knowledge.  No previous alcohol related  complications.  No diabetes, hypertension or coronary disease.   HABITS:  See HPI.   FAMILY  HISTORY:  Basically unknown.  The patient does not know his  father and does not really talk to his mother.   SOCIAL HISTORY:  The patient is divorced.  Rickey Martin was with a couple of  buddies.  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   REVIEW OF SYSTEMS:  The patient worked until yesterday, not going into  work yesterday due to feeling so poorly.  Rickey Martin has significant dyspnea on  exertion which has been helped substantially by the use of the albuterol  inhaler, without which it would be hard for him to walk any significant  distance.  Ordinarily, Rickey Martin does not have any stomach problems such as  dyspepsia or trouble swallowing nor any constipation, diarrhea or rectal  bleeding.  No urinary difficulties.  No history of alcohol withdrawal  problems or DTs in the past.  Of note, Rickey Martin reports a roughly 25-pound  weight loss over the past year, admitting that Rickey Martin does not eat right  but  it sounds like his appetite is generally okay.  Recently, the patient  has had some epigastric discomfort and is reporting some while in the  emergency room as well, but this is not extreme.   PHYSICAL EXAMINATION:  VITAL SIGNS:  Systolic blood pressure  approximately 76, heart rate approximately 100.  GENERAL APPEARANCE:  This is a lean, but not cachectic, Caucasian male  in no acute distress.  Rickey Martin is alert, coherent and not shocky.  His skin  is warm and dry.  HEENT:  Anicteric, no frank pallor.  CHEST:  Diminished breath sounds with diffuse expiratory rhonchi.  No  rales.  HEART:  Normal, no arrhythmia or gallop or murmur appreciated, although  heart sounds are somewhat distant.  ABDOMEN:  Benign.  Bowel sounds are present.  No bruits heard.  No  hepatosplenomegaly is appreciated and there is flank tympani and a very  flat abdomen suggesting the absence of ascites.  No guarding, mass or  tenderness.  RECTAL:  Not performed since the patient had just recently had a melenic  stool.  NEUROLOGIC:  The  patient is alert, coherent and appropriate with perhaps  a minimal tremor.  No obvious cranial nerve or focal motor deficits.   LABORATORY DATA:  White count 18,900, hemoglobin 8.1 with an MCV of 95  and an RDW normal at 13, platelets 374,000.  Differential shows 76  polys, 18 lymphocytes.  INR is normal at 1.  Chemistry profile pertinent  for sodium of 128, potassium 3.4, BUN 16 with creatinine of 0.6.  Somewhat surprisingly, liver chemistries are normal with bilirubin 0.4,  alk phos 74, AST 27, ALT 21.  Albumin 2.6, amylase normal at 49.  Urine  drug screen is positive for opiates urinalysis shows a small amount of  glucose.   IMPRESSION:  1. Upper gastrointestinal bleed characterized by coffee-ground emesis      and melenic stools, presumably secondary to nonsteroidal anti-      inflammatory drug gastropathy.  2. Posthemorrhagic anemia, severe.  3. Syncope, presumably due to volume depletion.  Questionable      seizure activity which is probably simply related to the      hypotension.  4. Ethanol abuse but without physical exam stigmata of chronic liver      disease or laboratory parameters to suggest cirrhosis.  5. Significant weight loss per patient's history, with hypoalbuminemia      most likely on a nutritional rather than a hepatic basis.  6. Severe chronic obstructive pulmonary disease at a relatively young      age, but with a roughly 40 pack-year smoking history at this stage      in life.   PLAN:  1. IV fluids, IV PPI therapy and transfusion.  2. Early endoscopy, ideally after 1-2 units of blood have been      transfuse, probably several hours from now.  3. Internal medicine consultation to assist with pulmonary issues and      alcohol withdrawal, smoking cessation and any issues regarding      seizure activity which does not appear will be a problem.   For now, I am not going to start the patient on octreotide since I think  the likelihood of this bleed being due to  portal hypertension is quite  low.  However, management may evolve according to the endoscopic  findings and his clinical evolution.           ______________________________  Bernette Redbird, M.D.  RB/MEDQ  D:  02/21/2008  T:  02/22/2008  Job:  [id number]

## 2011-04-24 NOTE — Discharge Summary (Signed)
NAMESHARIF, RENDELL NO.:  [id number]   MEDICAL RECORD NO.:  [id number]          PATIENT TYPE:  INP   LOCATION:  3037                         FACILITY:  [facility]   PHYSICIAN:  Bernette Redbird, M.D.   DATE OF BIRTH:  Apr 17, 1966   DATE OF ADMISSION:  02/21/2008  DATE OF DISCHARGE:  02/27/2008                               DISCHARGE SUMMARY   [name] was admitted to [facility] system on March 14,  20009. He was discharged on February 27, 2008.   ADMITTING DIAGNOSES:  1. Upper GI bleed.  2. Acute blood loss anemia.  3. Syncope secondary to hypotension and possible seizure.  4. Alcohol abuse.  5. Tobacco abuse.  6. Recreational drug, marijuana.  7. Weight loss.  8. Severe chronic obstructive pulmonary disease.   DISCHARGE DIAGNOSES:  1. GI bleed secondary to deep duodenal ulcer.  2. Acute blood loss anemia.  3. Severe chronic obstructive pulmonary disease.  4. Polysubstance abuse.   SERVICE:  Gastroenterology.   ATTENDING:  Dr. Bernette Redbird   CONSULTS:  1. Dr. Brien Few of [facility] who saw the patient on March 14, for      management of severe COPD, possible seizures, as well as      polysubstance abuse.  2. Clinical social work who saw the patient due to financial concerns      and establish an appointment for them with [facility] so that they      would have a primary care physician and interview sent to the [facility] Medication Assistance Program.   PROCEDURES.:  1. Upper endoscopy with biopsies by Dr. Bernette Redbird done on March      14. Results were a duodenal ulcer with dirty base, small hiatal      hernia, minimal antral gastropathy.  Biopsy showed chronic      gastritis and no H.  Pylori.  2. Upper endoscopy done February 24, 2008, showed no active bleeding,      dirty-based duodenal ulcer with probable stigma of recent bleed.  3. On February 21, 2008 the patient had a CT of his head for possible      seizure activity that was negative.  4. Also chest x-ray that showed COPD, but no active cardiopulmonary      disease.   BRIEF HISTORY AND PHYSICAL:  This is a 45 year old male with a history  of heavy tobacco and alcohol use who also plans to use marijuana.  He  reports having approximately 50 doses of NSAIDs, BC powders and Advil  over the last month. He experienced coffee ground emesis and black  stools approximately 36 hours prior to admission. He had an episode of  syncope and was brought to the emergency room. His hemoglobin at  admission was 8.1 and systolic blood pressure was 70.   HOSPITAL COURSE:  The patient was admitted by gastroenterology to the  step-down unit. He received IV fluids, PPI therapy and transfusion of  packed red blood cells as well as thiamine, multivitamins and a nicotine  patch.  He had an upper endoscopy  as noted above. Over the next day he  improved and was transferred to a regular room. Overnight between March  16 the 17th, he re-bled. This was evidenced by black and red stools as  well as a drop in hemoglobin from 8.1-5.2 overnight.  He was transferred  back to back to step-down and re-endoscoped. Further he was given  additional blood transfusion. From that point on he improved and his  diet was advanced.  He was able to ambulate around without any  dizziness.  His blood pressure improved and he was transferred back to  the floor. He was strongly counseled about alcohol and tobacco cessation  particularly tobacco cessation as his COPD is severe and was told  clearly not take NSAIDs and understood all these instructions.  He  received a prescription for PPI therapy for the next 6 weeks. When he  was seen by clinical social work the eligibility process for [facility]  was started and a follow-up appointment with [facility] was  established.   The patient received a total of 6 units of packed red blood cells  transfused this admission. After 3 days with no signs of bleeding and  stable  hemoglobin, the patient was DC to home in good condition.  His  hemoglobin was 11.1 on the day of discharge. Blood pressure was 109/67.  He  was tolerating solids and ambulating well and had minimal pain. He  has a follow-up appointment at [facility] on March 24 at 3:15 p.m.   DISCHARGE MEDICATIONS:  1. Omeprazole 40 mg once a day times 8 weeks.  2. Nicotine patch 21 mg times 5 weeks.  Prescriptions were given for      these. The patient was advised no smoking, no NSAIDs including      Advil, Motrin, BC powders. He was told if he found any additional      bright red blood per rectum or any further hematemesis to please      call [facility] back.      Stephani Police, PA    ______________________________  Bernette Redbird, M.D.    MLY/MEDQ  D:  03/01/2008  T:  03/01/2008  Job:  [id number]   cc:   Keane Scrape, M.D.  Isidor Holts, M.D.

## 2011-04-24 NOTE — Op Note (Signed)
Rickey Martin, SUNDT NO.:  [id number]   MEDICAL RECORD NO.:  [id number]          PATIENT TYPE:  INP   LOCATION:  2304                         FACILITY:  [facility]   PHYSICIAN:  Bernette Redbird, M.D.   DATE OF BIRTH:  [birth year]   DATE OF PROCEDURE:  02/21/2008  DATE OF DISCHARGE:  02/21/2008                               OPERATIVE REPORT   PROCEDURE:  Upper endoscopy with biopsies.   INDICATION:  A 45 year old unassigned patient who came to the emergency  room last night with a history of coffee-ground emesis and melenic  stools of approximately 24 hours' duration, hypotensive, hemoglobin 8.1  prior to hydration.  He smokes heavily, drinks heavily and has been  using NSAIDs.   FINDINGS:  Deep duodenal ulcer without active bleeding.   PROCEDURE IN DETAIL:  The nature, purpose and risks of the procedure  have been discussed with the patient who provided written consent.  The  procedure was done at the bedside in the surgical ICU.  He had received  Ativan for alcohol withdrawal prophylaxis and was quite sedated prior to  the procedure so no additional medication was provided.  We passed the  Pentax video endoscope under direct vision.  The vocal cords were not  well seen but the esophagus was readily entered.  There were some focal  erosive changes distally in the esophagus consistent with  erosive  reflux esophagitis but no severe esophagitis, no varices, no infection.  No evidence of esophageal cancer, no stricture.  A small hiatal hernia  was present.   The stomach was entered.  It contained a small bilious residual.  Despite a history of significant Advil exposure, there was no  significant NSAID gastropathy, just some minimal erosive changes.  A  retroflexed view of the cardia showed a small hiatal hernia with a  slightly patulous diaphragmatic hiatus but no other worrisome findings  such as gastric varices.  The pylorus and duodenal bulb looked normal  but  just beyond the duodenal bulb at the junction of the bulb and second  duodenum was a deep scooped out duodenal ulcer probably measuring about  3.5 cm across.  It had a very dirty base but no discrete visible vessel  was seen.  I was able to get the scope past this area into the second  portion of the duodenum despite a fair amount of surrounding edema.   As noted, no blood or coffee grounds were seen in the stomach.  The  scope was withdrawn into the antrum of the stomach where several  biopsies were obtained for pathology to check for Helicobacter pylori  infection.  Then the scope was removed from the patient who tolerated  the procedure well and without apparent complications.   IMPRESSION:  1. No active bleeding or blood in the stomach at the time of this      examination.  2. Deep duodenal ulcer with dirty base which is no doubt the source of      the patient's coffee-ground emesis and melenic stools.  No      intervention performed on this  ulcer since a discrete visible      vessel was not seen and since no bleeding was occurring at the time      of this exam.  3. Small hiatal hernia with minimal erosive esophagitis.  4. Minimal antral gastropathy, probably from NSAID exposure.   PLAN:  Intensify antipeptic therapy.  Treat Helicobacter pylori  infection if it is confirmed to be present on biopsy results.  Continue  otherwise previous plan of care including frequent hemoglobin  determinations and transfusion support as needed.           ______________________________  Bernette Redbird, M.D.     RB/MEDQ  D:  02/21/2008  T:  02/22/2008  Job:  [id number]

## 2011-04-24 NOTE — Op Note (Signed)
Rickey Martin, TUPY NO.:  [id number]   MEDICAL RECORD NO.:  [id number]          PATIENT TYPE:  INP   LOCATION:  3306                         FACILITY:  [facility]   PHYSICIAN:  Bernette Redbird, M.D.   DATE OF BIRTH:  [birth year]   DATE OF PROCEDURE:  02/24/2008  DATE OF DISCHARGE:                               OPERATIVE REPORT   PROCEDURE PERFORMED:  Upper endoscopy.   INDICATIONS:  45 year old unassigned patient with a history of heavy  drinking and ibuprofen usage who presented to the hospital three days  ago with a GI bleed.  Endoscopy at that time showed a large dirty based  duodenal ulcer.  He was doing well until yesterday when he began to feel  whoozy.  This morning his hemoglobin had dropped 3 grams from the day  before, and he had dark stool that became maroon.  Thus, there was  evidence of a delayed free bleed.   FINDINGS:  Dirty based ulcer without bleeding.   PROCEDURE:  The nature, purpose, and risks of the procedure were  familiar to the patient from prior examination.  He provided written  consent.  Sedation was Benadryl 25 mg, fentanyl 125 mcg, and Versed 12.5  mg IV without clinical instability or desaturation.  The Pentax video  endoscope was passed under direct vision.  The esophagus was quite  easily entered and was normal in its entirety except for small hiatal  hernia.  No reflux esophagitis, Barrett's esophagus, varices, infection,  neoplasia, or Mallory-Weiss tear was observed.  The stomach contained a  small bilious residual without blood or coffee ground material and the  gastric mucosa was normal, without evidence of gastritis, erosions,  ulcers, polyps, or masses including a retroflexed view of the cardia  that showed a minimally patulous diaphragmatic hiatus.  The pylorus was  normal.  At the junction of the duodenal bulb and second duodenum, as  noted three days ago, there was a large dirty based duodenal ulcer but,  in contrast to  three days ago, the majority of the ulcer appeared to  have white exudate over it and only about a third of it had a dirty  base.  No discrete visible vessel or protuberant vessel was seen, nor  was there any adherent clot or blood in the duodenal lumen.  There was  no specific site to inject or clip or cauterize, so I elected to do no  intervention.  The second duodenum looked normal.  The scope was removed  from the patient who tolerated the procedure well without apparent  complication.   IMPRESSION:  1. No active bleeding at the time of this examination.  Clinically and      based on labs, it appears that his bleeding was yesterday and that      the blood he is passing per rectum today reflects delayed transit.  2. Dirty based duodenal ulcer with probable stigma of recent      hemorrhage but no active bleeding.   PLAN:  We will proceed with previously arranged plans, transfer to the  step down unit, resume  a clear liquid diet and IV Protonix infusion,  continue sucralfate, and monitor the patient clinically.           ______________________________  Bernette Redbird, M.D.    RB/MEDQ  D:  02/24/2008  T:  02/24/2008  Job:  [id number]

## 2011-04-24 NOTE — Consult Note (Signed)
Rickey Martin, IM NO.:  [id number]   MEDICAL RECORD NO.:  [id number]          PATIENT TYPE:  INP   LOCATION:  2304                         FACILITY:  [facility]   PHYSICIAN:  Isidor Holts, M.D.  DATE OF BIRTH:  15-Oct-1966   DATE OF CONSULTATION:  02/21/2008  DATE OF DISCHARGE:                                 CONSULTATION   PMD:  Unassigned.   REQUESTING PHYSICIAN:  Bernette Redbird, M.D., gastroenterologist.   REASON FOR CONSULTATION:  Syncope, ETOH abuse, COPD.   HISTORY OF PRESENT ILLNESS:  This is a 45 year old male, with a past  medical history as stated below.  Reportedly, the patient passed out,  while sitting on a couch watching TV, at approximately 11:00 p.m. on  February 20, 2008.  There was no associated tongue-biting, urinary or fecal  incontinence.  According to his friend, who was present in the emergency  department and supplied the history, there was no direct observed  account at the time this happened, but he learned that the patient had  slumped all of a sudden and then a lady who was present at that time  yelled out, and he came rushing into the room to find the patient  shaking all over, eyes wide open.  He called emergency medical services.  While in the emergency department, the patient became very lightheaded  and dizzy; however, did not pass out again.  The patient admits to  having experienced abdominal pain for the past two days, associated with  vomiting and coffee grounds.  He had been passing black stools for the  past two days.  Reportedly, he had been taking over-the-counter Advil  for abdominal pain.  In the emergency department, he was found to have a  positive stool guaiac.  Hemoglobin was found to be low at 8.1.  Dr.  Matthias Hughs has requested medical consultation to manage the patient's COPD,  smoking, and alcohol abuse.   PAST MEDICAL HISTORY:  1. COPD.  2. Smoking history.  3. Alcohol abuse.   MEDICATIONS:  1. Primatene  mist, query compliance.  2. Over-the-counter Advil p.r.n.   ALLERGIES:  No known drug allergies.   REVIEW OF SYSTEMS:  As per HPI and chief complaint, otherwise negative.   SOCIAL HISTORY:  Patient works in [employer] and exercise [employer].  He is  single.  He has one offspring.  Smokes about two packs of cigarettes per  day and has done so for the past 20+ years.  He drinks alcohol, about 6-  8 beers per day.  His last alcohol use was at 8:30 p.m. on February 20, 2008.  He denies drug abuse.   FAMILY HISTORY:  Noncontributory.   PHYSICAL EXAMINATION:  VITALS:  Temperature 97, pulse 80 per minute and  regular, respiratory rate 20, BP initially 107/56 mmHg, rechecked at  88/55 mmHg.  Pulse oximetry 97% on room air.  Patient does not appear to be in any visible acute distress at the time  of this evaluation, alert and oriented, not short of breath at rest.  HEENT:  Moderate clinical pallor with sharp disks.  No conjunctival  injection.  Throat is clear.  NECK:  Supple.  JVP not seen.  No palpable lymphadenopathy.  No palpable  goiter.  CHEST:  Clinically clear to auscultation.  No wheezes, no crackles.  HEART SOUNDS:  S1 and S2 heard.  Normal.  Regular.  No murmurs.  ABDOMEN:  Flat, soft, nontender.  No palpable organomegaly.  No palpable  masses.  Normal bowel sounds.  LOWER EXTREMITIES:  No pitting edema.  Palpable peripheral pulses.  MUSCULOSKELETAL:  Unremarkable.  CENTRAL NERVOUS SYSTEM:  No focal neurological deficits on gross  examination.   INVESTIGATIONS:  CBC:  WBC 18.9, hemoglobin 8.2, hematocrit 33.4,  platelets 374.  Electrolytes:  Sodium 128, potassium 3.4, chloride 93,  CO2 26, BUN 16, creatinine 0.66, glucose 110.  Urinalysis negative.  Urine drug screen is positive only for opiates.   Head CT scan dated February 21, 2008:  This is negative for acute  intracranial pathology.   Chest x-ray dated February 21, 2008 shows COPD, chronic fracture, T12.  No  active  cardiopulmonary disease.   A 12-lead EKG dated on February 21, 2008 shows sinus rhythm, regular, at 81  per minute.  Normal axis.  Positive right bundle branch block pattern.  No acute ischemic changes.   ASSESSMENT/PLAN/RECOMMENDATIONS:  1. Upper gastrointestinal bleed:  Likely NSAID and alcohol-induced.      We shall defer to primary; however, the patient will be commenced      on proton pump inhibitor.  Likely, he will need endoscopy.   1. Acute blood-loss anemia secondary to #1 above:  Patient has been      typed and crossmatched.  Transfusion will be instituted p.r.n. and      serial Hb/Hct followed.   1. Hypotension:  This is secondary to #s 1 and 2 above, and should      respond to adequate volume repletion with intravenous fluids and      blood  transfusion.   1. Syncopal episode:  This is likely secondary to hypotension and      orthostasis, secondary to #s 1 and 2 above and should respond to      above-mentioned measures.   1. Chronic obstructive pulmonary disease:  Will utilize bronchodilator      nebulizers, and oxygen supplementation.  The patient has no      clinical evidence of exacerbation at present.   1. Smoking history:  We shall counsel appropriately and institute      Nicoderm CQ patch.   1. History of alcohol abuse:  We shall counsel appropriately.  Manage      with vitamins supplements and institute Ativan withdrawal protocol.   Thank you for the consultation.  We shall follow with you.      Isidor Holts, M.D.  Electronically Signed     CO/MEDQ  D:  02/21/2008  T:  02/21/2008  Job:  [id number]

## 2011-05-10 IMAGING — CT CT ABD-PELV W/ CM
2 of 5 series · 17 of 46 positions shown, 19 images · IV contrast (omnipaque)
Comparison: 08/29/2009

CLINICAL DATA: Abdominal pain, GI bleed.

CT ABDOMEN AND PELVIS WITH CONTRAST
TECHNIQUE: Multidetector CT imaging of the abdomen and pelvis was
performed using the standard protocol following bolus
administration of intravenous contrast.
Contrast: 100 ml Omnipaque 300 IV.

[Series 2: routine abdomen · axial · 0.70mm/px · z∈[-491,-96]mm · 14 of 89 slices shown, 16 images]
[im 5/89  soft-tissue]
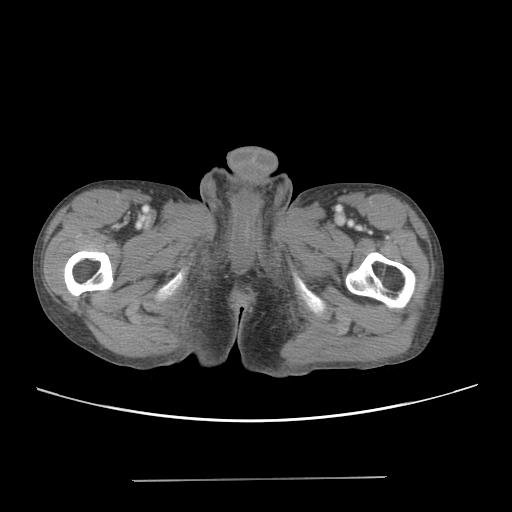
[im 5/89  bone]
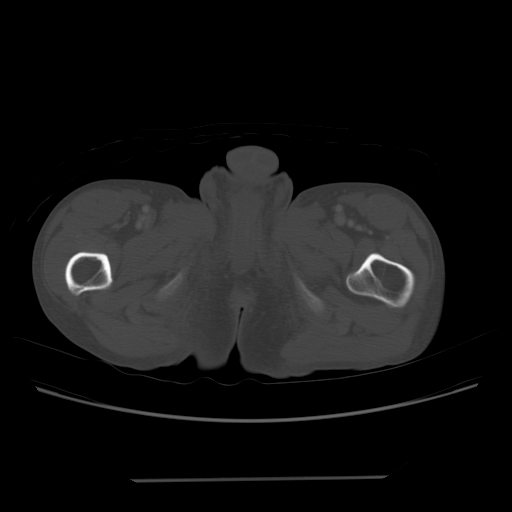
[im 10/89  soft-tissue]
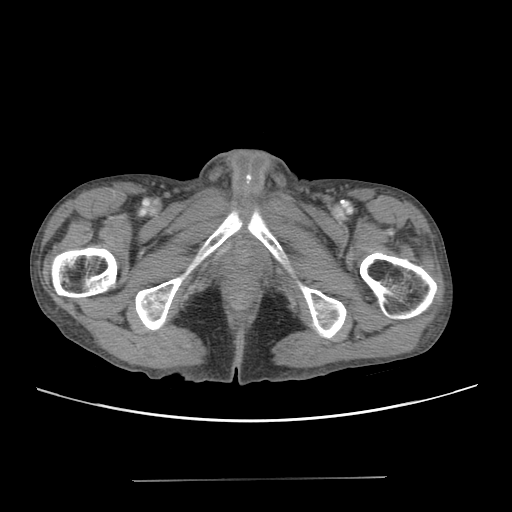
[im 19/89  soft-tissue]
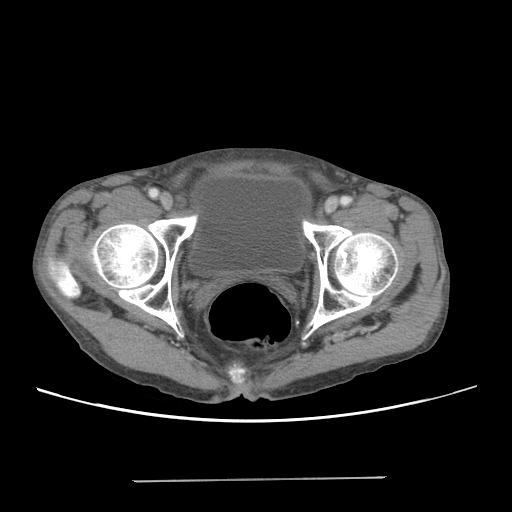
[im 24/89  soft-tissue]
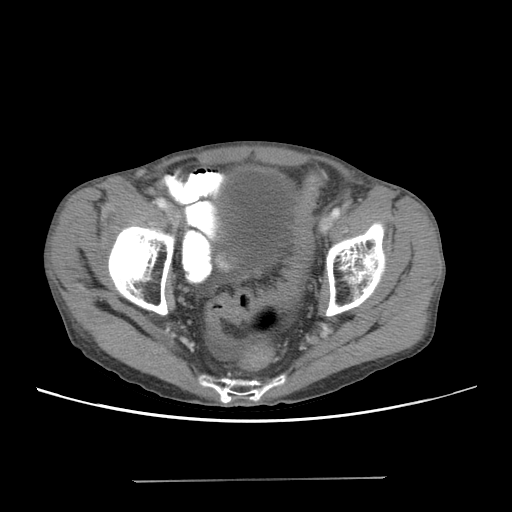
[im 28/89  soft-tissue]
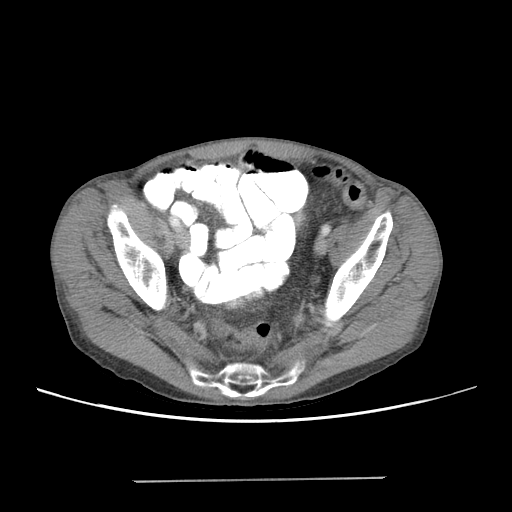
[im 38/89  soft-tissue]
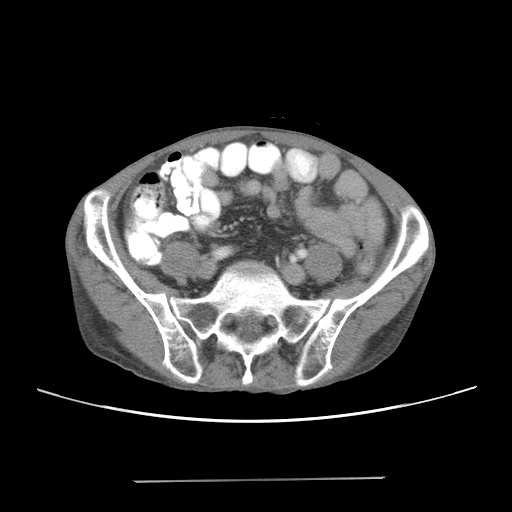
[im 42/89  soft-tissue]
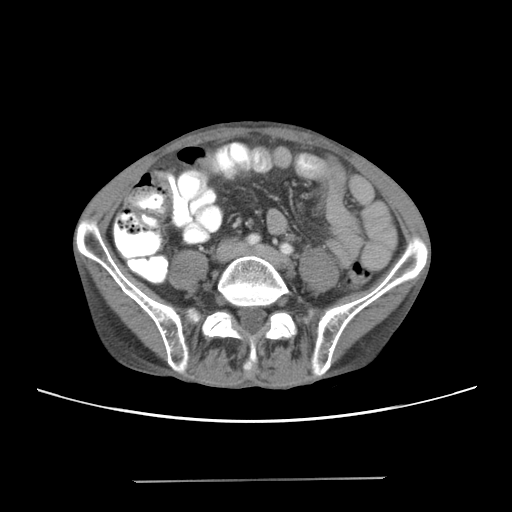
[im 47/89  soft-tissue]
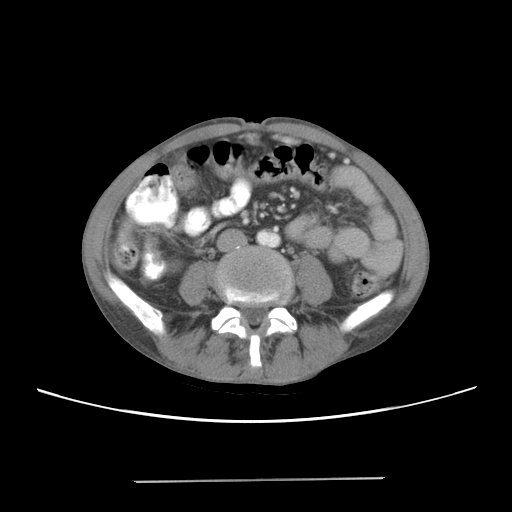
[im 51/89  soft-tissue]
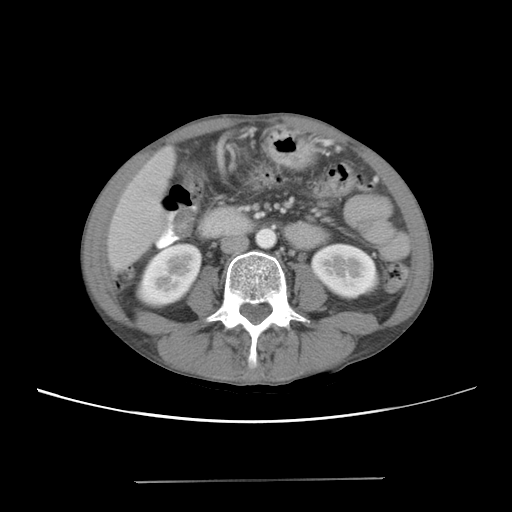
[im 51/89  bone]
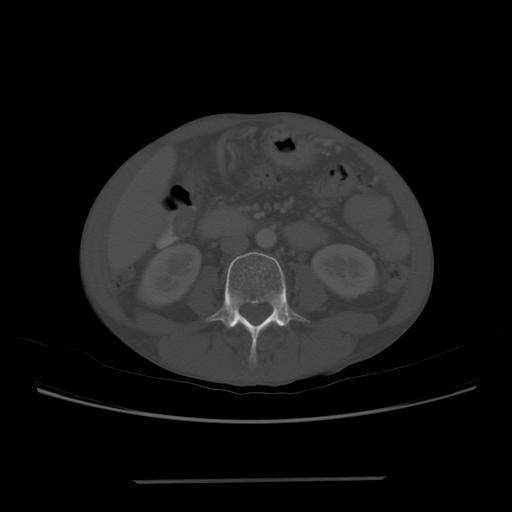
[im 61/89  soft-tissue]
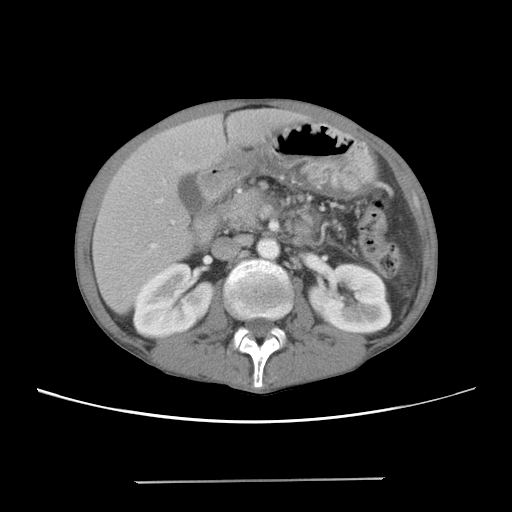
[im 65/89  soft-tissue]
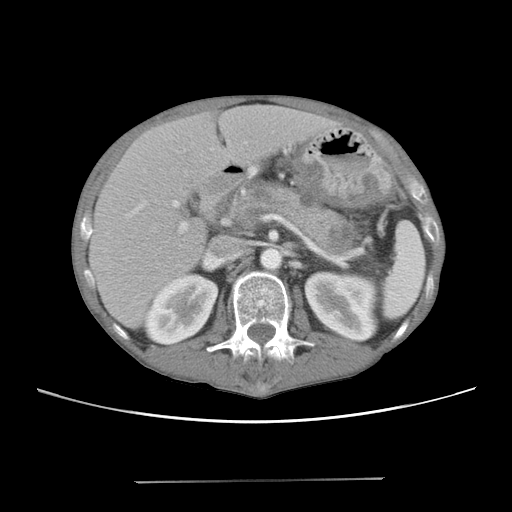
[im 70/89  soft-tissue]
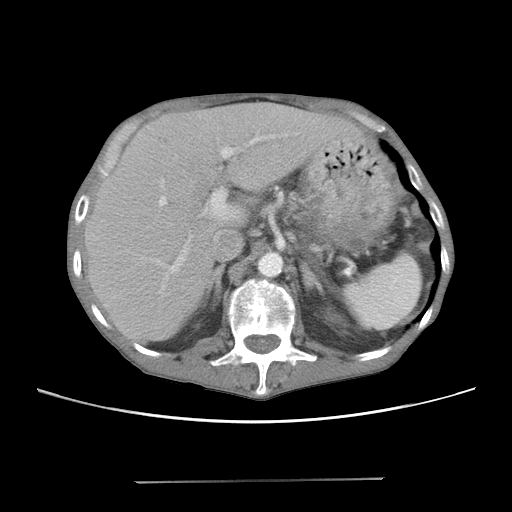
[im 79/89  soft-tissue]
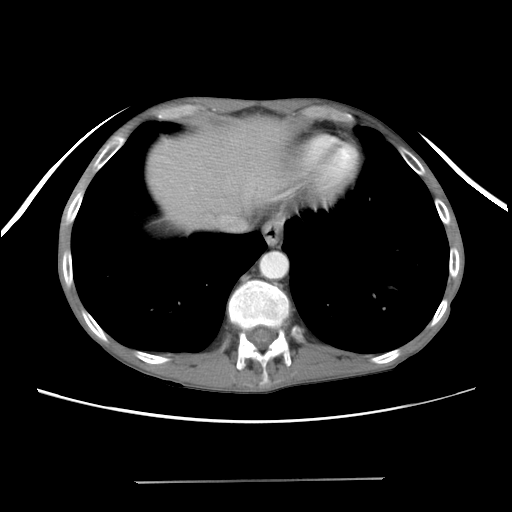
[im 84/89  soft-tissue]
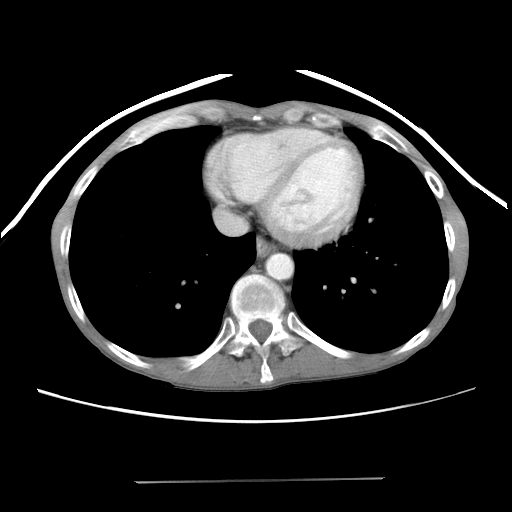

[Series 401: reformatted · coronal · 0.70mm/px · 3 of 91 slices shown]
[im 31/91  soft-tissue]
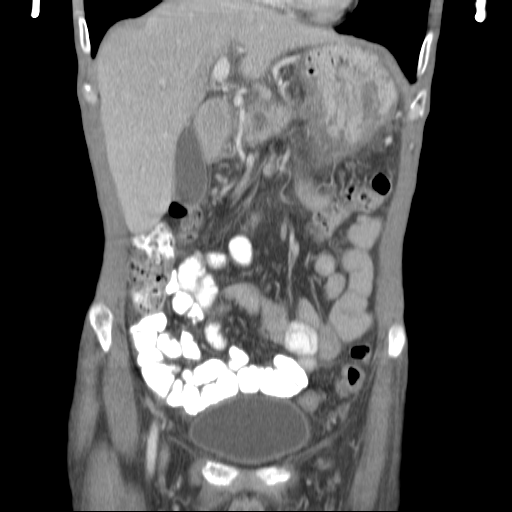
[im 41/91  soft-tissue]
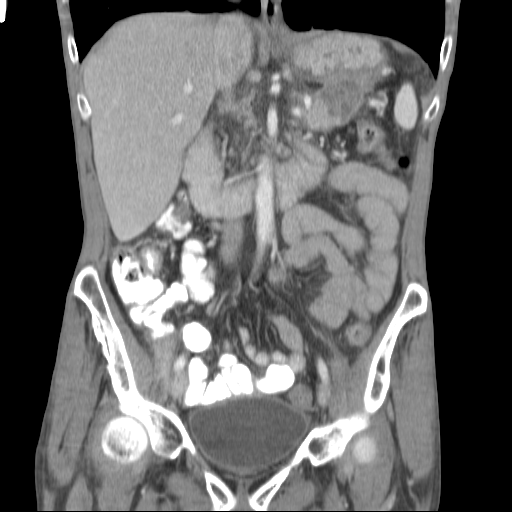
[im 51/91  soft-tissue]
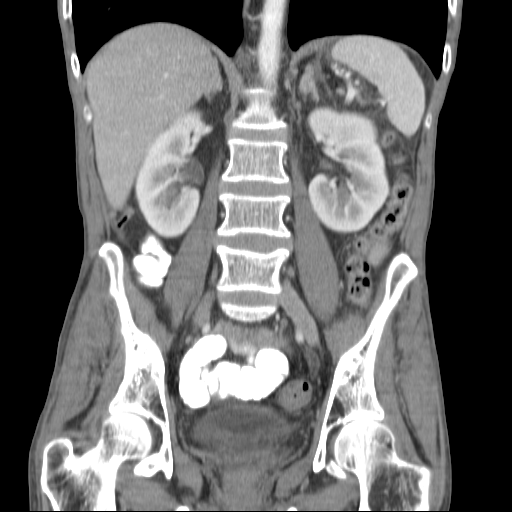

[17 of 46 positions shown; findings below may reference images not displayed]

FINDINGS: Scarring or atelectasis noted in the lung bases.  Heart
is normal size.  No effusions.

Mild stranding noted in the peripancreatic region, concerning for
acute pancreatitis.  In addition, there is a new partially
thrombosed aneurysm in the region of the pancreatic tail near the
splenic hilum.  The entire aneurysm, including a thrombosed area is
3.2 cm in size.  This was not present on prior study.

Continued cystic areas with again the pancreas, likely pseudocysts,
unchanged since prior study.

Liver, spleen, adrenals, gallbladder, kidneys unremarkable.

Slight wall thickening noted in the posterior gastric wall, likely
related to adjacent inflammatory change in the pancreas. Remainder
of small bowel in the abdomen and pelvis is decompressed.  No
obstruction.  Small amount of free fluid noted in the cul-de-sac of
the pelvis.  No obvious colonic abnormality.  Appendix is
visualized near the tip of the liver and is normal.

Bladder, prostate and seminal vesicles are unremarkable. No acute
bony abnormality.  Old healed right rib fractures noted.
IMPRESSION: Findings are concerning for acute pancreatitis changes.  In
addition, there are stable pseudocyst within the pancreatic body
and a new partially thrombosed pseudoaneurysm in the region of the
pancreatic tail/splenic hilum.  This measures 3.2 cm in greatest
diameter.

Small amount of free fluid in the pelvis.

These results were called to Dr. Timi at the time of
interpretation.

## 2011-05-21 ENCOUNTER — Emergency Department (HOSPITAL_BASED_OUTPATIENT_CLINIC_OR_DEPARTMENT_OTHER)
Admission: EM | Admit: 2011-05-21 | Discharge: 2011-05-21 | Disposition: A | Discharge status: Home / Self Care | Payer: Self-pay | Attending: Emergency Medicine | Admitting: Emergency Medicine

## 2011-05-21 DIAGNOSIS — R1013 Epigastric pain: Secondary | ICD-10-CM | POA: Insufficient documentation

## 2011-05-21 DIAGNOSIS — J438 Other emphysema: Secondary | ICD-10-CM | POA: Insufficient documentation

## 2011-05-21 DIAGNOSIS — R110 Nausea: Secondary | ICD-10-CM | POA: Insufficient documentation

## 2011-05-21 DIAGNOSIS — F1010 Alcohol abuse, uncomplicated: Secondary | ICD-10-CM | POA: Insufficient documentation

## 2011-05-21 DIAGNOSIS — K859 Acute pancreatitis without necrosis or infection, unspecified: Secondary | ICD-10-CM | POA: Insufficient documentation

## 2011-05-21 DIAGNOSIS — F17200 Nicotine dependence, unspecified, uncomplicated: Secondary | ICD-10-CM | POA: Insufficient documentation

## 2011-05-21 LAB — CBC
HCT: 35.3 % — ABNORMAL LOW (ref 39.0–52.0)
Hemoglobin: 11.9 g/dL — ABNORMAL LOW (ref 13.0–17.0)
RBC: 4.47 MIL/uL (ref 4.22–5.81)
RDW: 17.3 % — ABNORMAL HIGH (ref 11.5–15.5)
WBC: 13.8 10*3/uL — ABNORMAL HIGH (ref 4.0–10.5)

## 2011-05-21 LAB — DIFFERENTIAL
Basophils Absolute: 0.1 10*3/uL (ref 0.0–0.1)
Lymphocytes Relative: 25 % (ref 12–46)
Neutro Abs: 9.1 10*3/uL — ABNORMAL HIGH (ref 1.7–7.7)
Neutrophils Relative %: 66 % (ref 43–77)

## 2011-05-21 LAB — COMPREHENSIVE METABOLIC PANEL
Albumin: 3.8 g/dL (ref 3.5–5.2)
Alkaline Phosphatase: 137 U/L — ABNORMAL HIGH (ref 39–117)
BUN: 6 mg/dL (ref 6–23)
Chloride: 96 mEq/L (ref 96–112)
Glucose, Bld: 97 mg/dL (ref 70–99)
Potassium: 4.1 mEq/L (ref 3.5–5.1)
Total Bilirubin: 0.3 mg/dL (ref 0.3–1.2)

## 2011-05-21 LAB — ETHANOL: Alcohol, Ethyl (B): <11 mg/dL — ABNORMAL HIGH (ref 0–10)

## 2011-07-23 ENCOUNTER — Emergency Department (HOSPITAL_BASED_OUTPATIENT_CLINIC_OR_DEPARTMENT_OTHER)
Admission: EM | Admit: 2011-07-23 | Discharge: 2011-07-23 | Disposition: A | Discharge status: Home / Self Care | Payer: Self-pay | Attending: Emergency Medicine | Admitting: Emergency Medicine

## 2011-07-23 ENCOUNTER — Encounter: Payer: Self-pay | Admitting: *Deleted

## 2011-07-23 DIAGNOSIS — F17200 Nicotine dependence, unspecified, uncomplicated: Secondary | ICD-10-CM | POA: Insufficient documentation

## 2011-07-23 DIAGNOSIS — R1013 Epigastric pain: Secondary | ICD-10-CM | POA: Insufficient documentation

## 2011-07-23 DIAGNOSIS — R112 Nausea with vomiting, unspecified: Secondary | ICD-10-CM | POA: Insufficient documentation

## 2011-07-23 DIAGNOSIS — K859 Acute pancreatitis without necrosis or infection, unspecified: Secondary | ICD-10-CM | POA: Insufficient documentation

## 2011-07-23 HISTORY — DX: Gastric ulcer, unspecified as acute or chronic, without hemorrhage or perforation: K25.9

## 2011-07-23 HISTORY — DX: Abdominal aortic aneurysm, without rupture: I71.4

## 2011-07-23 HISTORY — DX: Abdominal aortic aneurysm, without rupture, unspecified: I71.40

## 2011-07-23 HISTORY — DX: Acute pancreatitis without necrosis or infection, unspecified: K85.90

## 2011-07-23 LAB — AMYLASE: Amylase: 102 U/L (ref 0–105)

## 2011-07-23 LAB — COMPREHENSIVE METABOLIC PANEL
Albumin: 4.1 g/dL (ref 3.5–5.2)
BUN: 10 mg/dL (ref 6–23)
Calcium: 9.5 mg/dL (ref 8.4–10.5)
Creatinine, Ser: 0.7 mg/dL (ref 0.50–1.35)
Total Protein: 8.1 g/dL (ref 6.0–8.3)

## 2011-07-23 LAB — CBC
HCT: 39.3 % (ref 39.0–52.0)
Hemoglobin: 13.3 g/dL (ref 13.0–17.0)
MCH: 27.9 pg (ref 26.0–34.0)
MCHC: 33.8 g/dL (ref 30.0–36.0)

## 2011-07-23 LAB — DIFFERENTIAL
Basophils Relative: 1 % (ref 0–1)
Eosinophils Absolute: 0 10*3/uL (ref 0.0–0.7)
Eosinophils Relative: 0 % (ref 0–5)
Monocytes Absolute: 1.3 10*3/uL — ABNORMAL HIGH (ref 0.1–1.0)
Monocytes Relative: 10 % (ref 3–12)
Neutrophils Relative %: 64 % (ref 43–77)

## 2011-07-23 LAB — LIPASE, BLOOD: Lipase: 81 U/L — ABNORMAL HIGH (ref 11–59)

## 2011-07-23 MED ORDER — OXYCODONE-ACETAMINOPHEN 5-325 MG PO TABS: 2.0000 | ORAL_TABLET | ORAL | Status: AC | PRN

## 2011-07-23 MED ORDER — HYDROMORPHONE HCL 1 MG/ML IJ SOLN
INTRAMUSCULAR | Status: AC
  Administered 2011-07-23: 1 mg
  Filled 2011-07-23: qty 1

## 2011-07-23 MED ORDER — HYDROMORPHONE HCL 1 MG/ML IJ SOLN: 1.0000 mg | Freq: Once | INTRAMUSCULAR | Status: DC

## 2011-07-23 MED ORDER — ONDANSETRON HCL 4 MG/2ML IJ SOLN
4.0000 mg | Freq: Once | INTRAMUSCULAR | Status: AC
  Administered 2011-07-23: 4 mg via INTRAVENOUS
  Filled 2011-07-23: qty 2

## 2011-07-23 MED ORDER — HYDROMORPHONE HCL 1 MG/ML IJ SOLN
1.0000 mg | Freq: Once | INTRAMUSCULAR | Status: AC
  Administered 2011-07-23: 1 mg via INTRAVENOUS
  Filled 2011-07-23: qty 1

## 2011-07-23 MED ORDER — SODIUM CHLORIDE 0.9 % IV SOLN
999.0000 mL | Freq: Once | INTRAVENOUS | Status: AC
  Administered 2011-07-23: 1000 mL via INTRAVENOUS

## 2011-07-23 MED ORDER — SODIUM CHLORIDE 0.9 % IV BOLUS (SEPSIS)
1000.0000 mL | Freq: Once | INTRAVENOUS | Status: AC
  Administered 2011-07-23: 1000 mL via INTRAVENOUS

## 2011-07-23 MED ORDER — PROMETHAZINE HCL 25 MG PO TABS: 25.0000 mg | ORAL_TABLET | Freq: Four times a day (QID) | ORAL | Status: DC | PRN

## 2011-07-23 NOTE — ED Notes (Signed)
Report to Talbert Nan, RN

## 2011-07-23 NOTE — ED Provider Notes (Signed)
History     CSN: [id number] Arrival date & time: 07/23/2011  5:10 PM  Chief Complaint  Patient presents with  . Abdominal Pain   Patient is a 45 y.o. male presenting with abdominal pain. The history is provided by the patient.  Abdominal Pain The primary symptoms of the illness include abdominal pain, nausea and vomiting. The current episode started 2 days ago. The onset of the illness was gradual. The problem has been gradually worsening.  The pain came on gradually. The abdominal pain has been gradually worsening since its onset. The abdominal pain radiates to the epigastric region, suprapubic region and periumbilical region. The severity of the abdominal pain is 9/10. Eating relieves the abdominal pain. The abdominal pain is exacerbated by movement and deep breathing.  Associated with: no longer drinks. Additional symptoms associated with the illness include back pain. Significant associated medical issues include liver disease and substance abuse. Significant associated medical issues do not include PUD, GERD or inflammatory bowel disease.    Past Medical History  Diagnosis Date  . Pancreatitis   . AAA (abdominal aortic aneurysm)   . Gastric peptic ulcer     Past Surgical History  Procedure Date  . Abdominal surgery     No family history on file.  History  Substance Use Topics  . Smoking status: Current Everyday Smoker -- 1.5 packs/day  . Smokeless tobacco: Not on file  . Alcohol Use:      states no etoh since june 2012      Review of Systems  Gastrointestinal: Positive for nausea, vomiting and abdominal pain.  Musculoskeletal: Positive for back pain.  All other systems reviewed and are negative.    Physical Exam  BP 143/81  Pulse 94  Temp(Src) 98.1 F (36.7 C) (Oral)  Resp 20  Ht 5\' 8"  (1.727 m)  Wt 116 lb (52.617 kg)  BMI 17.64 kg/m2  SpO2 98%  Physical Exam  Nursing note and vitals reviewed. Constitutional: He is oriented to person, place, and time.  He appears well-developed and well-nourished.  HENT:  Head: Normocephalic and atraumatic.  Eyes: Conjunctivae are normal. Pupils are equal, round, and reactive to light.  Neck: Normal range of motion. Neck supple.  Pulmonary/Chest: Effort normal and breath sounds normal.  Abdominal: Soft. There is tenderness. There is guarding.  Musculoskeletal: Normal range of motion.  Neurological: He is alert and oriented to person, place, and time. He has normal reflexes.  Skin: Skin is warm.  Psychiatric: He has a normal mood and affect.    ED Course  Procedures  MDM Pt given IV fluids x 3 liters,  Dilaudid Iv,  Zofran IV.   Pt advised to return if symptoms worsen or change.      Langston Masker, [place] 07/23/11 2050

## 2011-07-23 NOTE — ED Notes (Signed)
Pt reports abd pain starting around 1 pm yesterday- states "it feels like my pancreas but i haven't been drinking"- denies etoh in "last couple months"

## 2011-07-23 NOTE — ED Notes (Signed)
Checked with Pt for the U/A . Pt has not obtained yet

## 2011-07-23 NOTE — ED Notes (Signed)
Pt is unable to void.  Langston Masker, PA-C at bedside for evaluation

## 2011-07-24 NOTE — ED Provider Notes (Signed)
Evaluation and management procedures were performed by the PA/NP under my supervision/collaboration.   Dione Booze, MD 07/24/11 [phone]

## 2011-08-29 LAB — I-STAT 8, (EC8 V) (CONVERTED LAB)
BUN: <3 — ABNORMAL LOW
Chloride: 98
HCT: 48
Hemoglobin: 16.3
Operator id: 161631
Potassium: 3.9
Sodium: 129 — ABNORMAL LOW
pCO2, Ven: 46.4

## 2011-08-29 LAB — DIFFERENTIAL
Lymphocytes Relative: 35
Lymphs Abs: 5.9 — ABNORMAL HIGH
Monocytes Absolute: 0.8
Monocytes Relative: 5
Neutro Abs: 9.9 — ABNORMAL HIGH
Neutrophils Relative %: 59

## 2011-08-29 LAB — CBC
Hemoglobin: 15.1
MCHC: 34.5
RBC: 4.43
WBC: 16.7 — ABNORMAL HIGH

## 2011-08-29 LAB — POCT I-STAT CREATININE: Creatinine, Ser: 0.8

## 2011-08-30 LAB — BLOOD GAS, ARTERIAL
Bicarbonate: 23.2
O2 Content: 2
Patient temperature: 37
pH, Arterial: 7.403

## 2011-08-30 LAB — BASIC METABOLIC PANEL
BUN: 2 — ABNORMAL LOW
CO2: 24
Chloride: 93 — ABNORMAL LOW
Potassium: 3.4 — ABNORMAL LOW

## 2011-08-30 LAB — DIFFERENTIAL
Basophils Relative: 1
Eosinophils Absolute: 0.2
Eosinophils Relative: 2
Lymphs Abs: 4.1 — ABNORMAL HIGH
Monocytes Relative: 8

## 2011-08-30 LAB — CBC
HCT: 43.1
MCHC: 33.9
MCV: 99.4
RBC: 4.33
WBC: 9.7

## 2011-08-30 LAB — POCT CARDIAC MARKERS
Operator id: 189501
Troponin i, poc: <0.05

## 2011-09-03 LAB — CBC
HCT: 23.8 — ABNORMAL LOW
HCT: 22.9 — ABNORMAL LOW
HCT: 23.8 — ABNORMAL LOW
HCT: 24.3 — ABNORMAL LOW
HCT: 24.6 — ABNORMAL LOW
HCT: 32.8 — ABNORMAL LOW
HCT: 37.1 — ABNORMAL LOW
Hemoglobin: 8.1 — ABNORMAL LOW
Hemoglobin: 11.1 — ABNORMAL LOW
Hemoglobin: 7.8 — CL
Hemoglobin: 8.4 — ABNORMAL LOW
Hemoglobin: 8.5 — ABNORMAL LOW
Hemoglobin: 13.1
MCHC: 34.7
MCHC: 33.9
MCHC: 33.9
MCHC: 34.4
MCHC: 34.5
MCHC: 34.6
MCV: 92.9
MCV: 99.5
MCV: 93.3
MCV: 93.5
MCV: 94
MCV: 94.1
MCV: 95.7
MCV: 92.5
Platelets: 235
Platelets: 258
Platelets: 374
Platelets: 224
Platelets: 225
Platelets: 231
Platelets: 249
Platelets: 264
Platelets: 282
RBC: 3.33 — ABNORMAL LOW
RBC: 3.42 — ABNORMAL LOW
RDW: 14.8
RDW: 15.1
RDW: 16.5 — ABNORMAL HIGH
RDW: 16.5 — ABNORMAL HIGH
RDW: 16.5 — ABNORMAL HIGH
RDW: 16.9 — ABNORMAL HIGH
RDW: 15.3
WBC: 10.7 — ABNORMAL HIGH
WBC: 18.9 — ABNORMAL HIGH
WBC: 10.5
WBC: 11.1 — ABNORMAL HIGH
WBC: 9.4
WBC: 9.8

## 2011-09-03 LAB — AMYLASE: Amylase: 49

## 2011-09-03 LAB — COMPREHENSIVE METABOLIC PANEL
ALT: 13
AST: 20
Albumin: 2.2 — ABNORMAL LOW
Albumin: 1.9 — ABNORMAL LOW
Alkaline Phosphatase: 56
Alkaline Phosphatase: 60
Alkaline Phosphatase: 108
BUN: 10
BUN: 3 — ABNORMAL LOW
BUN: 1 — ABNORMAL LOW
CO2: 23
CO2: 21
Calcium: 7 — ABNORMAL LOW
Calcium: 7.5 — ABNORMAL LOW
Chloride: 92 — ABNORMAL LOW
Creatinine, Ser: 0.51
Creatinine, Ser: 0.69
GFR calc Af Amer: >60
GFR calc Af Amer: >60
GFR calc non Af Amer: >60
GFR calc non Af Amer: >60
Glucose, Bld: 94
Glucose, Bld: 77
Potassium: 4
Potassium: 4
Potassium: 4.2
Sodium: 127 — ABNORMAL LOW
Total Bilirubin: 0.7
Total Protein: 3.8 — ABNORMAL LOW
Total Protein: 4.1 — ABNORMAL LOW

## 2011-09-03 LAB — BASIC METABOLIC PANEL
BUN: 16
BUN: 2 — ABNORMAL LOW
BUN: 2 — ABNORMAL LOW
CO2: 22
CO2: 26
Calcium: 7.7 — ABNORMAL LOW
Calcium: 7.7 — ABNORMAL LOW
Chloride: 93 — ABNORMAL LOW
Chloride: 101
Chloride: 95 — ABNORMAL LOW
Creatinine, Ser: 0.66
Creatinine, Ser: 0.59
Creatinine, Ser: 0.62
GFR calc Af Amer: >60
GFR calc Af Amer: >60
GFR calc non Af Amer: >60
GFR calc non Af Amer: >60
GFR calc non Af Amer: >60
Glucose, Bld: 106 — ABNORMAL HIGH
Potassium: 3.8
Potassium: 4
Sodium: 134 — ABNORMAL LOW

## 2011-09-03 LAB — CROSSMATCH
ABO/RH(D): A POS
Antibody Screen: NEGATIVE

## 2011-09-03 LAB — LIPASE, BLOOD: Lipase: 166 — ABNORMAL HIGH

## 2011-09-03 LAB — HEMOGLOBIN AND HEMATOCRIT, BLOOD
HCT: 24.8 — ABNORMAL LOW
Hemoglobin: 9.2 — ABNORMAL LOW

## 2011-09-03 LAB — RAPID URINE DRUG SCREEN, HOSP PERFORMED
Amphetamines: NOT DETECTED
Benzodiazepines: NOT DETECTED
Cocaine: NOT DETECTED
Tetrahydrocannabinol: NOT DETECTED

## 2011-09-03 LAB — PROTIME-INR
INR: 1
INR: 1.1
INR: 0.9
Prothrombin Time: 14.4
Prothrombin Time: 12.4

## 2011-09-03 LAB — DIFFERENTIAL
Basophils Absolute: 0.3 — ABNORMAL HIGH
Basophils Relative: 2 — ABNORMAL HIGH
Eosinophils Absolute: 0.1
Lymphs Abs: 3.4
Neutro Abs: 11.1 — ABNORMAL HIGH
Neutrophils Relative %: 76
Neutrophils Relative %: 71

## 2011-09-03 LAB — ABO/RH: ABO/RH(D): A POS

## 2011-09-03 LAB — URINALYSIS, ROUTINE W REFLEX MICROSCOPIC
Glucose, UA: 250 — AB
Hgb urine dipstick: NEGATIVE
Ketones, ur: 15 — AB
pH: 6

## 2011-09-03 LAB — ETHANOL: Alcohol, Ethyl (B): 52 — ABNORMAL HIGH

## 2011-09-03 LAB — HEPATIC FUNCTION PANEL
Albumin: 2.6 — ABNORMAL LOW
Alkaline Phosphatase: 74
Total Protein: 4.9 — ABNORMAL LOW

## 2011-09-03 LAB — APTT: aPTT: 24

## 2012-02-12 ENCOUNTER — Inpatient Hospital Stay (HOSPITAL_COMMUNITY)
Admission: EM | Admit: 2012-02-12 | Discharge: 2012-02-15 | DRG: 439 | Disposition: A | Discharge status: Home / Self Care | Payer: 59 | Attending: Internal Medicine | Admitting: Internal Medicine

## 2012-02-12 ENCOUNTER — Emergency Department (HOSPITAL_BASED_OUTPATIENT_CLINIC_OR_DEPARTMENT_OTHER)
Admission: EM | Admit: 2012-02-12 | Discharge: 2012-02-12 | Disposition: A | Discharge status: Home / Self Care | Payer: 59 | Source: Home / Self Care | Attending: Emergency Medicine | Admitting: Emergency Medicine

## 2012-02-12 ENCOUNTER — Encounter (HOSPITAL_BASED_OUTPATIENT_CLINIC_OR_DEPARTMENT_OTHER): Payer: Self-pay

## 2012-02-12 ENCOUNTER — Encounter (HOSPITAL_COMMUNITY): Payer: Self-pay | Admitting: Emergency Medicine

## 2012-02-12 ENCOUNTER — Inpatient Hospital Stay (HOSPITAL_COMMUNITY): Payer: 59

## 2012-02-12 DIAGNOSIS — Z8711 Personal history of peptic ulcer disease: Secondary | ICD-10-CM

## 2012-02-12 DIAGNOSIS — K859 Acute pancreatitis without necrosis or infection, unspecified: Secondary | ICD-10-CM

## 2012-02-12 DIAGNOSIS — I714 Abdominal aortic aneurysm, without rupture, unspecified: Secondary | ICD-10-CM | POA: Diagnosis present

## 2012-02-12 DIAGNOSIS — K259 Gastric ulcer, unspecified as acute or chronic, without hemorrhage or perforation: Secondary | ICD-10-CM | POA: Insufficient documentation

## 2012-02-12 DIAGNOSIS — F17200 Nicotine dependence, unspecified, uncomplicated: Secondary | ICD-10-CM | POA: Insufficient documentation

## 2012-02-12 DIAGNOSIS — R739 Hyperglycemia, unspecified: Secondary | ICD-10-CM | POA: Diagnosis present

## 2012-02-12 DIAGNOSIS — R112 Nausea with vomiting, unspecified: Secondary | ICD-10-CM | POA: Diagnosis present

## 2012-02-12 DIAGNOSIS — D72829 Elevated white blood cell count, unspecified: Secondary | ICD-10-CM | POA: Diagnosis present

## 2012-02-12 DIAGNOSIS — E871 Hypo-osmolality and hyponatremia: Secondary | ICD-10-CM | POA: Diagnosis present

## 2012-02-12 DIAGNOSIS — K852 Alcohol induced acute pancreatitis without necrosis or infection: Secondary | ICD-10-CM | POA: Diagnosis present

## 2012-02-12 DIAGNOSIS — E878 Other disorders of electrolyte and fluid balance, not elsewhere classified: Secondary | ICD-10-CM | POA: Diagnosis present

## 2012-02-12 DIAGNOSIS — R7309 Other abnormal glucose: Secondary | ICD-10-CM | POA: Diagnosis present

## 2012-02-12 LAB — GLUCOSE, CAPILLARY: Glucose-Capillary: 77 mg/dL (ref 70–99)

## 2012-02-12 LAB — COMPREHENSIVE METABOLIC PANEL
ALT: 10 U/L (ref 0–53)
AST: 19 U/L (ref 0–37)
Albumin: 4.1 g/dL (ref 3.5–5.2)
Albumin: 3.3 g/dL — ABNORMAL LOW (ref 3.5–5.2)
Alkaline Phosphatase: 98 U/L (ref 39–117)
BUN: 6 mg/dL (ref 6–23)
Calcium: 9.1 mg/dL (ref 8.4–10.5)
Creatinine, Ser: 0.7 mg/dL (ref 0.50–1.35)
GFR calc Af Amer: >90 mL/min (ref >90)
GFR calc Af Amer: >90 mL/min (ref >90)
Glucose, Bld: 110 mg/dL — ABNORMAL HIGH (ref 70–99)
Glucose, Bld: 82 mg/dL (ref 70–99)
Potassium: 4.6 mEq/L (ref 3.5–5.1)
Sodium: 130 mEq/L — ABNORMAL LOW (ref 135–145)
Total Protein: 7.7 g/dL (ref 6.0–8.3)
Total Protein: 6.8 g/dL (ref 6.0–8.3)

## 2012-02-12 LAB — HEMOGLOBIN A1C
Hgb A1c MFr Bld: 5.9 % — ABNORMAL HIGH (ref <5.7)
Mean Plasma Glucose: 123 mg/dL — ABNORMAL HIGH (ref <117)

## 2012-02-12 LAB — CBC
Hemoglobin: 13.7 g/dL (ref 13.0–17.0)
MCH: 28.8 pg (ref 26.0–34.0)
MCHC: 33.6 g/dL (ref 30.0–36.0)
MCV: 82.4 fL (ref 78.0–100.0)
Platelets: 384 10*3/uL (ref 150–400)
Platelets: 354 10*3/uL (ref 150–400)
RBC: 4.76 MIL/uL (ref 4.22–5.81)
RDW: 16.4 % — ABNORMAL HIGH (ref 11.5–15.5)
WBC: 10.6 10*3/uL — ABNORMAL HIGH (ref 4.0–10.5)

## 2012-02-12 LAB — URINALYSIS, ROUTINE W REFLEX MICROSCOPIC
Bilirubin Urine: NEGATIVE
Hgb urine dipstick: NEGATIVE
Ketones, ur: 40 mg/dL — AB
Nitrite: NEGATIVE
Urobilinogen, UA: 0.2 mg/dL (ref 0.0–1.0)

## 2012-02-12 LAB — DIFFERENTIAL
Basophils Absolute: 0.1 10*3/uL (ref 0.0–0.1)
Basophils Relative: 0 % (ref 0–1)
Eosinophils Relative: 0 % (ref 0–5)
Eosinophils Relative: 0 % (ref 0–5)
Lymphocytes Relative: 9 % — ABNORMAL LOW (ref 12–46)
Lymphs Abs: 1.7 10*3/uL (ref 0.7–4.0)
Monocytes Absolute: 1.1 10*3/uL — ABNORMAL HIGH (ref 0.1–1.0)
Monocytes Relative: 8 % (ref 3–12)
Neutro Abs: 12.4 10*3/uL — ABNORMAL HIGH (ref 1.7–7.7)
Neutrophils Relative %: 73 % (ref 43–77)
Neutrophils Relative %: 83 % — ABNORMAL HIGH (ref 43–77)

## 2012-02-12 LAB — RAPID URINE DRUG SCREEN, HOSP PERFORMED
Barbiturates: NOT DETECTED
Benzodiazepines: NOT DETECTED
Cocaine: NOT DETECTED

## 2012-02-12 LAB — APTT: aPTT: 33 seconds (ref 24–37)

## 2012-02-12 LAB — TSH: TSH: 2.547 u[IU]/mL (ref 0.350–4.500)

## 2012-02-12 LAB — LIPASE, BLOOD: Lipase: 207 U/L — ABNORMAL HIGH (ref 11–59)

## 2012-02-12 LAB — ETHANOL: Alcohol, Ethyl (B): <11 mg/dL (ref 0–11)

## 2012-02-12 LAB — PROTIME-INR
INR: 1.03 (ref 0.00–1.49)
Prothrombin Time: 13.7 seconds (ref 11.6–15.2)

## 2012-02-12 MED ORDER — SODIUM CHLORIDE 0.9 % IV SOLN
Freq: Once | INTRAVENOUS | Status: AC
  Administered 2012-02-12: 14:00:00 via INTRAVENOUS

## 2012-02-12 MED ORDER — INSULIN ASPART 100 UNIT/ML ~~LOC~~ SOLN: 0.0000 [IU] | Freq: Every day | SUBCUTANEOUS | Status: DC

## 2012-02-12 MED ORDER — HYDROMORPHONE HCL PF 1 MG/ML IJ SOLN
1.0000 mg | Freq: Once | INTRAMUSCULAR | Status: AC
  Administered 2012-02-12: 1 mg via INTRAVENOUS
  Filled 2012-02-12: qty 1

## 2012-02-12 MED ORDER — FENTANYL CITRATE 0.05 MG/ML IJ SOLN
100.0000 ug | Freq: Once | INTRAMUSCULAR | Status: AC
  Administered 2012-02-12: 100 ug via INTRAVENOUS
  Filled 2012-02-12: qty 2

## 2012-02-12 MED ORDER — OXYCODONE-ACETAMINOPHEN 5-325 MG PO TABS
1.0000 | ORAL_TABLET | ORAL | Status: DC | PRN
  Administered 2012-02-12 – 2012-02-15 (×15): 1 via ORAL
  Filled 2012-02-12 (×15): qty 1

## 2012-02-12 MED ORDER — ALBUTEROL SULFATE HFA 108 (90 BASE) MCG/ACT IN AERS: 1.0000 | INHALATION_SPRAY | Freq: Four times a day (QID) | RESPIRATORY_TRACT | Status: DC | PRN

## 2012-02-12 MED ORDER — SODIUM CHLORIDE 0.9 % IV BOLUS (SEPSIS)
500.0000 mL | Freq: Once | INTRAVENOUS | Status: AC
  Administered 2012-02-12: 500 mL via INTRAVENOUS

## 2012-02-12 MED ORDER — ENOXAPARIN SODIUM 30 MG/0.3ML ~~LOC~~ SOLN
30.0000 mg | SUBCUTANEOUS | Status: DC
  Administered 2012-02-12 – 2012-02-14 (×3): 30 mg via SUBCUTANEOUS
  Filled 2012-02-12 (×5): qty 0.3

## 2012-02-12 MED ORDER — SODIUM CHLORIDE 0.9 % IV BOLUS (SEPSIS)
1000.0000 mL | Freq: Once | INTRAVENOUS | Status: AC
  Administered 2012-02-12: 1000 mL via INTRAVENOUS

## 2012-02-12 MED ORDER — HYDROMORPHONE HCL PF 1 MG/ML IJ SOLN
1.0000 mg | INTRAMUSCULAR | Status: AC | PRN
  Administered 2012-02-12 – 2012-02-13 (×5): 1 mg via INTRAVENOUS
  Filled 2012-02-12 (×6): qty 1

## 2012-02-12 MED ORDER — IOHEXOL 300 MG/ML  SOLN
40.0000 mL | Freq: Once | INTRAMUSCULAR | Status: AC | PRN
  Administered 2012-02-12: 40 mL via ORAL

## 2012-02-12 MED ORDER — METOCLOPRAMIDE HCL 5 MG/ML IJ SOLN
10.0000 mg | Freq: Once | INTRAMUSCULAR | Status: AC
  Administered 2012-02-12: 10 mg via INTRAVENOUS
  Filled 2012-02-12: qty 2

## 2012-02-12 MED ORDER — SODIUM CHLORIDE 0.9 % IV SOLN: INTRAVENOUS | Status: DC

## 2012-02-12 MED ORDER — SODIUM CHLORIDE 0.9 % IV SOLN
INTRAVENOUS | Status: DC
  Administered 2012-02-13 – 2012-02-14 (×5): via INTRAVENOUS

## 2012-02-12 MED ORDER — IOHEXOL 300 MG/ML  SOLN
100.0000 mL | Freq: Once | INTRAMUSCULAR | Status: AC | PRN
  Administered 2012-02-12: 100 mL via INTRAVENOUS

## 2012-02-12 MED ORDER — ALBUTEROL 90 MCG/ACT IN AERS: 1.0000 | INHALATION_SPRAY | Freq: Four times a day (QID) | RESPIRATORY_TRACT | Status: DC | PRN

## 2012-02-12 MED ORDER — ONDANSETRON HCL 4 MG/2ML IJ SOLN
4.0000 mg | Freq: Four times a day (QID) | INTRAMUSCULAR | Status: AC | PRN
  Administered 2012-02-12 – 2012-02-13 (×2): 4 mg via INTRAVENOUS
  Filled 2012-02-12 (×2): qty 2

## 2012-02-12 MED ORDER — THIAMINE HCL 100 MG PO TABS
100.0000 mg | ORAL_TABLET | Freq: Every day | ORAL | Status: DC
  Administered 2012-02-12 – 2012-02-15 (×4): 100 mg via ORAL
  Filled 2012-02-12 (×5): qty 1

## 2012-02-12 MED ORDER — NICOTINE 21 MG/24HR TD PT24
21.0000 mg | MEDICATED_PATCH | Freq: Every day | TRANSDERMAL | Status: DC
  Administered 2012-02-12 – 2012-02-14 (×3): 21 mg via TRANSDERMAL
  Filled 2012-02-12 (×4): qty 1

## 2012-02-12 MED ORDER — ONDANSETRON HCL 4 MG/2ML IJ SOLN: 4.0000 mg | Freq: Four times a day (QID) | INTRAMUSCULAR | Status: DC | PRN

## 2012-02-12 MED ORDER — ONDANSETRON HCL 4 MG/2ML IJ SOLN
4.0000 mg | Freq: Once | INTRAMUSCULAR | Status: AC
  Administered 2012-02-12: 4 mg via INTRAVENOUS
  Filled 2012-02-12: qty 2

## 2012-02-12 MED ORDER — INSULIN ASPART 100 UNIT/ML ~~LOC~~ SOLN: 0.0000 [IU] | Freq: Three times a day (TID) | SUBCUTANEOUS | Status: DC

## 2012-02-12 MED ORDER — HYDROMORPHONE HCL PF 1 MG/ML IJ SOLN: 1.0000 mg | INTRAMUSCULAR | Status: DC | PRN

## 2012-02-12 MED ORDER — SODIUM CHLORIDE 0.9 % IV SOLN
INTRAVENOUS | Status: AC
  Administered 2012-02-12: 20:00:00 via INTRAVENOUS

## 2012-02-12 NOTE — ED Notes (Signed)
Patient reports that he doesn't want to be transferred.  RN notified and IV removed.

## 2012-02-12 NOTE — ED Notes (Signed)
Pt refused to be transferred to hospital. Left AMA

## 2012-02-12 NOTE — ED Notes (Signed)
Nausea and abd pain since yesterday has hx of pancreatitis was seen at [facility] earlier this am and they wanted to admit him but he went home

## 2012-02-12 NOTE — ED Provider Notes (Signed)
History     CSN: [id number]  Arrival date & time 02/12/12  0105   First MD Initiated Contact with Patient 02/12/12 0121      Chief Complaint  Patient presents with  . Pancreatitis    (Consider location/radiation/quality/duration/timing/severity/associated sxs/prior treatment) Patient is a 46 y.o. male presenting with abdominal pain. The history is provided by the patient. No language interpreter was used.  Abdominal Pain The primary symptoms of the illness include abdominal pain and nausea. The primary symptoms of the illness do not include fever, fatigue, shortness of breath, vomiting, diarrhea, hematemesis, hematochezia or dysuria. The current episode started 13 to 24 hours ago. The onset of the illness was sudden. The problem has not changed since onset. The abdominal pain began 13 to24 hours ago. The pain came on suddenly. The abdominal pain has been unchanged since its onset. The abdominal pain is located in the periumbilical region. The abdominal pain does not radiate. The severity of the abdominal pain is 10/10. The abdominal pain is relieved by nothing. Exacerbated by: none.  Associated with: none. The patient has not had a change in bowel habit. Risk factors for an acute abdominal problem include a history of abdominal surgery. Symptoms associated with the illness do not include chills, anorexia, diaphoresis, heartburn or back pain. Significant associated medical issues include PUD.    Past Medical History  Diagnosis Date  . Pancreatitis   . AAA (abdominal aortic aneurysm)   . Gastric peptic ulcer     Past Surgical History  Procedure Date  . Abdominal surgery   . Cardiac surgery     No family history on file.  History  Substance Use Topics  . Smoking status: Current Everyday Smoker -- 1.5 packs/day  . Smokeless tobacco: Not on file  . Alcohol Use:      states no etoh since june 2012      Review of Systems  Constitutional: Negative for fever, chills, diaphoresis  and fatigue.  HENT: Negative.   Eyes: Negative.   Respiratory: Negative for shortness of breath.   Cardiovascular: Negative for chest pain.  Gastrointestinal: Positive for nausea and abdominal pain. Negative for heartburn, vomiting, diarrhea, hematochezia, anorexia and hematemesis.  Genitourinary: Negative for dysuria.  Musculoskeletal: Negative for back pain.  Skin: Negative.   Neurological: Negative.   Hematological: Negative.   Psychiatric/Behavioral: Negative.     Allergies  Review of patient's allergies indicates no known allergies.  Home Medications   Current Outpatient Rx  Name Route Sig Dispense Refill  . ALBUTEROL 90 MCG/ACT IN AERS Inhalation Inhale 1 puff into the lungs every 6 (six) hours as needed. Shortness of breath and wheezing     . OMEPRAZOLE MAGNESIUM 20 MG PO TBEC Oral Take 20 mg by mouth daily.      . OXYCODONE-ACETAMINOPHEN 5-325 MG PO TABS Oral Take 1 tablet by mouth every 4 (four) hours as needed. Pain      . THIAMINE HCL 100 MG PO TABS Oral Take 100 mg by mouth daily.        BP 164/72  Pulse 86  Temp(Src) 97.7 F (36.5 C) (Oral)  Resp 20  SpO2 100%  Physical Exam  Constitutional: He is oriented to person, place, and time. He appears well-developed and well-nourished.  HENT:  Head: Normocephalic and atraumatic.  Mouth/Throat: Oropharynx is clear and moist.  Eyes: Conjunctivae are normal. Pupils are equal, round, and reactive to light.  Neck: Normal range of motion. Neck supple.  Cardiovascular: Normal rate and regular  rhythm.   Pulmonary/Chest: Effort normal and breath sounds normal. He has no wheezes. He has no rales.  Abdominal: Soft. Bowel sounds are normal. There is tenderness in the periumbilical area and left upper quadrant. There is no rigidity, no rebound, no guarding, no tenderness at McBurney's point and negative Murphy's sign.  Musculoskeletal: Normal range of motion.  Neurological: He is alert and oriented to person, place, and time.    Skin: Skin is warm and dry. He is not diaphoretic.  Psychiatric: He has a normal mood and affect.    ED Course  Procedures (including critical care time)   Labs Reviewed  CBC  DIFFERENTIAL  COMPREHENSIVE METABOLIC PANEL  LIPASE, BLOOD   No results found.   No diagnosis found.    MDM  Continues to have 10/10 pain despite dilaudid will need admission for ongoing pain management        Trent Gabler K Derian Dimalanta-Rasch, MD 02/12/12 [phone]

## 2012-02-12 NOTE — ED Notes (Signed)
Pt sts hurting in lower abd,sharp,10/10

## 2012-02-12 NOTE — ED Provider Notes (Signed)
Medical screening examination/treatment/procedure(s) were conducted as a shared visit with non-physician practitioner(s) and myself.  I personally evaluated the patient during the encounter   Kayven Aldaco, MD 02/12/12 1634 

## 2012-02-12 NOTE — ED Provider Notes (Signed)
46 year old male had been in the emergency department at the [facility] last night with pancreatitis and admission had been recommended. He refused at that time but returned to the emergency department here when he was unable to control his pain at home. Pain is unable to be controlled adequately in the emergency department and arrangements are made for admission.  Dione Booze, MD 02/12/12 [phone]

## 2012-02-12 NOTE — ED Provider Notes (Signed)
History     CSN: [id number]  Arrival date & time 02/12/12  0906   First MD Initiated Contact with Patient 02/12/12 [phone]      Chief Complaint  Patient presents with  . Abdominal Pain    (Consider location/radiation/quality/duration/timing/severity/associated sxs/prior treatment) Patient is a 46 y.o. male presenting with abdominal pain. The history is provided by the patient and medical records.  Abdominal Pain The primary symptoms of the illness include abdominal pain, nausea and vomiting. The primary symptoms of the illness do not include fever, shortness of breath, diarrhea, hematemesis or dysuria. The current episode started yesterday. The onset of the illness was sudden. The problem has not changed since onset. The abdominal pain began yesterday. The pain came on suddenly. The abdominal pain has been unchanged since its onset. Pain Location: upper abdomen. The abdominal pain radiates to the periumbilical region. The severity of the abdominal pain is 10/10. The abdominal pain is relieved by nothing. Exacerbated by: nothing.  Associated with: denies alcohol consumption. Symptoms associated with the illness do not include heartburn, constipation or back pain. Significant associated medical issues include PUD.    Past Medical History  Diagnosis Date  . Pancreatitis   . AAA (abdominal aortic aneurysm)   . Gastric peptic ulcer     Past Surgical History  Procedure Date  . Abdominal surgery   . Cardiac surgery     No family history on file.  History  Substance Use Topics  . Smoking status: Current Everyday Smoker -- 1.5 packs/day  . Smokeless tobacco: Not on file  . Alcohol Use: Yes     states no etoh since june 2012      Review of Systems  Constitutional: Negative for fever.  Respiratory: Negative for shortness of breath.   Gastrointestinal: Positive for nausea, vomiting and abdominal pain. Negative for heartburn, diarrhea, constipation and hematemesis.  Genitourinary:  Negative for dysuria.  Musculoskeletal: Negative for back pain.  All other systems reviewed and are negative.    Allergies  Review of patient's allergies indicates no known allergies.  Home Medications   Current Outpatient Rx  Name Route Sig Dispense Refill  . ALBUTEROL 90 MCG/ACT IN AERS Inhalation Inhale 1 puff into the lungs every 6 (six) hours as needed. Shortness of breath and wheezing    . OXYCODONE-ACETAMINOPHEN 5-325 MG PO TABS Oral Take 1 tablet by mouth every 4 (four) hours as needed. Pain      . THIAMINE HCL 100 MG PO TABS Oral Take 100 mg by mouth daily.      Marland Kitchen B-1 PO Oral Take 1 tablet by mouth daily.      BP 157/100  Pulse 75  Temp(Src) 97.6 F (36.4 C) (Oral)  Resp 18  SpO2 99%  Physical Exam  Nursing note reviewed. Constitutional: He is oriented to person, place, and time. He appears well-developed and well-nourished. He appears distressed.       VS reviewed, sig for HTN  HENT:  Head: Normocephalic and atraumatic.       Mucous membranes moist  Eyes: Conjunctivae are normal. Pupils are equal, round, and reactive to light.  Neck: Normal range of motion. Neck supple.  Cardiovascular: Normal rate, regular rhythm, normal heart sounds and intact distal pulses.   Pulmonary/Chest: Effort normal and breath sounds normal. No respiratory distress. He has no wheezes. He exhibits no tenderness.  Abdominal: Soft. Bowel sounds are normal. He exhibits no distension. There is tenderness in the epigastric area, periumbilical area and left upper quadrant.  There is no rigidity, no rebound and no guarding.  Musculoskeletal: He exhibits no edema and no tenderness.  Neurological: He is alert and oriented to person, place, and time. No cranial nerve deficit.  Skin: Skin is warm and dry.  Psychiatric: He has a normal mood and affect.    ED Course  Procedures (including critical care time)    1. Pancreatitis       MDM  Pt with known hx pancreatitis, seen at [facility] this  morning but left AMA, refusing admission after it was recommended for pain control. Pt reports he did not receive any prescriptions. Pain and N/V have continued. I have reviewed his labs from the visit this morning, which demonstrate very slight leukocytosis, hyponatremia, hypochloremia, and an elevated lipase of 207. No repeat labs are indicated at this time. Will administer IV fluids, pain and nausea medication. If his pain cannot be controlled, will likely need admission.  2:09 PM Pain not controlled after 3 doses of IV dilaudid. Pt to be admitted under [facility], bed request has been placed. Temporary admit orders in place.        Shaaron Adler, PA-C 02/12/12 1410

## 2012-02-12 NOTE — H&P (Addendum)
PCP:  No primary provider on file.   DOA:  02/12/2012  9:12 AM  Chief Complaint:  Abdominal pain  HPI: 46 year old male with history of chronic alcohol abuse, chronic pancreatitis who presented to [facility] with complaints of worsening abdominal pain, associated with nausea and vomiting. Evaluation in [facility] revealed the patient has pancreatitis but patient left AGAINST MEDICAL ADVICE from there only to come back to [facility] for evaluation. At present patient complains of periumbilical abdominal pain, nonradiating, 7-8/10 in intensity, associated with nausea and vomiting. Patient had 2 episodes of vomiting prior to the admission, non bloody. There are no complaints of blood in the stool or urine. There are no complaints of fever, chills. No complaints of chest pain, shortness of breath, diaphoresis. No complaints of lightheadedness, dizziness or loss of consciousness  Assessment/Plan:  Principal Problem:   *Pancreatitis, alcoholic, acute - Keep n.p.o. - Continue IV fluids, normal saline at 125 cc an hour - Right analgesia with Dilaudid 1 mg every 2 hours as needed for severe pain and Percocet 1-2 tablets every 4 hours as needed for moderate pain - Advance diet as tolerated - Followup alcohol level - Trend lipase level  Active Problems:   Leukocytosis - Unclear etiology as patient is afebrile and no reported fever at home - It could be due to pancreatitis; we will get a CT abdomen pelvis with contrast - Followup urinalysis, urine culture, blood cultures - Will start Zosyn empirically with further treatment is dependent on what the laminar results show   Hyponatremia - Likely secondary to dehydration this is possible GI losses - No acute changes in mental status - IV fluids, normal saline at 125 cc an hour - Followup BMP in the morning  Hyperglycemia - Obtain A1c level - Sliding scale insulin   EDUCATION - test results and diagnostic studies  were discussed with patient and pt's family who was present at the bedside - patient and family have verbalized the understanding - questions were answered at the bedside and contact information was provided for additional questions or concerns  Allergies: No Known Allergies  Prior to Admission medications   Medication Sig Start Date End Date Taking? Authorizing Provider  albuterol (PROVENTIL,VENTOLIN) 90 MCG/ACT inhaler Inhale 1 puff into the lungs every 6 (six) hours as needed. Shortness of breath and wheezing   Yes Historical Provider, MD  oxyCODONE-acetaminophen (PERCOCET) 5-325 MG per tablet Take 1 tablet by mouth every 4 (four) hours as needed. Pain     Yes Historical Provider, MD  thiamine 100 MG tablet Take 100 mg by mouth daily.     Yes Historical Provider, MD  Thiamine HCl (B-1 PO) Take 1 tablet by mouth daily.   Yes Historical Provider, MD    Past Medical History  Diagnosis Date  . Pancreatitis   . AAA (abdominal aortic aneurysm)   . Gastric peptic ulcer     Past Surgical History  Procedure Date  . Abdominal surgery   . Cardiac surgery     Social History:  reports that he has been smoking.  He does not have any smokeless tobacco history on file. He reports that he drinks alcohol. He reports that he does not use illicit drugs.  No family history on file.  Review of Systems:  Constitutional: Denies fever, chills, diaphoresis, appetite change and fatigue.  HEENT: Denies photophobia, eye pain, redness, hearing loss, ear pain, congestion, sore throat, rhinorrhea, sneezing, mouth sores, trouble swallowing, neck pain, neck stiffness  and tinnitus.   Respiratory: Denies SOB, DOE, cough, chest tightness,  and wheezing.   Cardiovascular: Denies chest pain, palpitations and leg swelling.  Gastrointestinal: As per history of present illness  Genitourinary: Denies dysuria, urgency, frequency, hematuria, flank pain and difficulty urinating.  Musculoskeletal: Denies myalgias, back  pain, joint swelling, arthralgias and gait problem.  Skin: Denies pallor, rash and wound.  Neurological: Denies dizziness, seizures, syncope, weakness, light-headedness, numbness and headaches.  Hematological: Denies adenopathy. Easy bruising, personal or family bleeding history  Psychiatric/Behavioral: Denies suicidal ideation, mood changes, confusion, nervousness, sleep disturbance and agitation   Physical Exam:  Filed Vitals:   02/12/12 0911 02/12/12 1227 02/12/12 1600  BP: 157/100 136/75 142/77  Pulse: 75 66 61  Temp: 97.6 F (36.4 C)    TempSrc: Oral    Resp: 18 18 20   SpO2: 99% 99% 99%    Constitutional: Vital signs reviewed.  Patient is a well-developed and well-nourished in no acute distress and cooperative with exam. Alert and oriented x3.  Head: Normocephalic and atraumatic Ear: TM normal bilaterally Mouth: no erythema or exudates, MMM Eyes: PERRL, EOMI, conjunctivae normal, No scleral icterus.  Neck: Supple, Trachea midline normal ROM, No JVD, mass, thyromegaly, or carotid bruit present.  Cardiovascular: RRR, S1 normal, S2 normal, no MRG, pulses symmetric and intact bilaterally Pulmonary/Chest: CTAB, no wheezes, rales, or rhonchi Abdominal: Soft. Non-tender, non-distended, bowel sounds are normal, no masses, organomegaly, or guarding present.  GU: no CVA tenderness Musculoskeletal: No joint deformities, erythema, or stiffness, ROM full and no nontender Ext: no edema and no cyanosis, pulses palpable bilaterally (DP and PT) Hematology: no cervical, inginal, or axillary adenopathy.  Neurological: A&O x3, Strenght is normal and symmetric bilaterally, cranial nerve II-XII are grossly intact, no focal motor deficit, sensory intact to light touch bilaterally.  Skin: Warm, dry and intact. No rash, cyanosis, or clubbing.  Psychiatric: Normal mood and affect. speech and behavior is normal. Judgment and thought content normal. Cognition and memory are normal.   Labs on Admission:   Results for orders placed during the hospital encounter of 02/12/12 (from the past 48 hour(s))  CBC     Status: Abnormal   Collection Time   02/12/12  1:28 AM      Component Value Range Comment   WBC 10.6 (*) 4.0 - 10.5 (K/uL)    RBC 4.76  4.22 - 5.81 (MIL/uL)    Hemoglobin 13.7  13.0 - 17.0 (g/dL)    HCT 47.8  29.5 - 62.1 (%)    MCV 82.4  78.0 - 100.0 (fL)    MCH 28.8  26.0 - 34.0 (pg)    MCHC 34.9  30.0 - 36.0 (g/dL)    RDW 30.8 (*) 65.7 - 15.5 (%)    Platelets 384  150 - 400 (K/uL)   DIFFERENTIAL     Status: Abnormal   Collection Time   02/12/12  1:28 AM      Component Value Range Comment   Neutrophils Relative 73  43 - 77 (%)    Lymphocytes Relative 16  12 - 46 (%)    Monocytes Relative 10  3 - 12 (%)    Eosinophils Relative 0  0 - 5 (%)    Basophils Relative 1  0 - 1 (%)    Neutro Abs 7.7  1.7 - 7.7 (K/uL)    Lymphs Abs 1.7  0.7 - 4.0 (K/uL)    Monocytes Absolute 1.1 (*) 0.1 - 1.0 (K/uL)    Eosinophils Absolute 0.0  0.0 - 0.7 (K/uL)    Basophils Absolute 0.1  0.0 - 0.1 (K/uL)    RBC Morphology TEARDROP CELLS      WBC Morphology ATYPICAL LYMPHOCYTES   WHITE COUNT CONFIRMED ON SMEAR   Smear Review PLATELET COUNT CONFIRMED BY SMEAR     COMPREHENSIVE METABOLIC PANEL     Status: Abnormal   Collection Time   02/12/12  1:28 AM      Component Value Range Comment   Sodium 130 (*) 135 - 145 (mEq/L)    Potassium 4.4  3.5 - 5.1 (mEq/L) HEMOLYZED SPECIMEN, RESULTS MAY BE AFFECTED   Chloride 91 (*) 96 - 112 (mEq/L)    CO2 28  19 - 32 (mEq/L)    Glucose, Bld 110 (*) 70 - 99 (mg/dL)    BUN 6  6 - 23 (mg/dL)    Creatinine, Ser 1.61  0.50 - 1.35 (mg/dL)    Calcium 9.1  8.4 - 10.5 (mg/dL)    Total Protein 7.7  6.0 - 8.3 (g/dL)    Albumin 4.1  3.5 - 5.2 (g/dL)    AST 29  0 - 37 (U/L) HEMOLYZED SPECIMEN, RESULTS MAY BE AFFECTED   ALT 12  0 - 53 (U/L)    Alkaline Phosphatase 109  39 - 117 (U/L)    Total Bilirubin 0.2 (*) 0.3 - 1.2 (mg/dL)    GFR calc non Af Amer >90  >90 (mL/min)    GFR  calc Af Amer >90  >90 (mL/min)   LIPASE, BLOOD     Status: Abnormal   Collection Time   02/12/12  1:28 AM      Component Value Range Comment   Lipase 207 (*) 11 - 59 (U/L)     Time Spent on Admission: Greater than 30 minutes  Pearl Berlinger 02/12/2012, 4:23 PM

## 2012-02-13 ENCOUNTER — Encounter (HOSPITAL_COMMUNITY): Payer: Self-pay | Admitting: General Surgery

## 2012-02-13 DIAGNOSIS — K859 Acute pancreatitis without necrosis or infection, unspecified: Secondary | ICD-10-CM

## 2012-02-13 LAB — COMPREHENSIVE METABOLIC PANEL
ALT: 7 U/L (ref 0–53)
AST: 14 U/L (ref 0–37)
CO2: 25 mEq/L (ref 19–32)
Calcium: 8.4 mg/dL (ref 8.4–10.5)
Chloride: 95 mEq/L — ABNORMAL LOW (ref 96–112)
Creatinine, Ser: 0.55 mg/dL (ref 0.50–1.35)
GFR calc Af Amer: >90 mL/min (ref >90)
GFR calc non Af Amer: >90 mL/min (ref >90)
Glucose, Bld: 80 mg/dL (ref 70–99)
Sodium: 129 mEq/L — ABNORMAL LOW (ref 135–145)
Total Bilirubin: 0.2 mg/dL — ABNORMAL LOW (ref 0.3–1.2)

## 2012-02-13 LAB — GLUCOSE, CAPILLARY
Glucose-Capillary: 83 mg/dL (ref 70–99)
Glucose-Capillary: 72 mg/dL (ref 70–99)
Glucose-Capillary: 80 mg/dL (ref 70–99)

## 2012-02-13 LAB — CBC
Hemoglobin: 12.4 g/dL — ABNORMAL LOW (ref 13.0–17.0)
MCH: 28.3 pg (ref 26.0–34.0)
MCHC: 34 g/dL (ref 30.0–36.0)
Platelets: 343 10*3/uL (ref 150–400)
RDW: 16.3 % — ABNORMAL HIGH (ref 11.5–15.5)

## 2012-02-13 LAB — LIPASE, BLOOD: Lipase: 31 U/L (ref 11–59)

## 2012-02-13 LAB — URINE CULTURE: Culture: NO GROWTH

## 2012-02-13 MED ORDER — LORAZEPAM 1 MG PO TABS: 1.0000 mg | ORAL_TABLET | Freq: Four times a day (QID) | ORAL | Status: DC | PRN

## 2012-02-13 MED ORDER — PIPERACILLIN-TAZOBACTAM 3.375 G IVPB
3.3750 g | Freq: Three times a day (TID) | INTRAVENOUS | Status: DC
  Administered 2012-02-13 – 2012-02-15 (×6): 3.375 g via INTRAVENOUS
  Filled 2012-02-13 (×9): qty 50

## 2012-02-13 MED ORDER — LORAZEPAM 2 MG/ML IJ SOLN: 0.0000 mg | Freq: Two times a day (BID) | INTRAMUSCULAR | Status: DC

## 2012-02-13 MED ORDER — VITAMIN B-1 100 MG PO TABS: 100.0000 mg | ORAL_TABLET | Freq: Every day | ORAL | Status: DC

## 2012-02-13 MED ORDER — ONDANSETRON HCL 4 MG/2ML IJ SOLN
4.0000 mg | Freq: Four times a day (QID) | INTRAMUSCULAR | Status: DC | PRN
  Administered 2012-02-13 – 2012-02-15 (×5): 4 mg via INTRAVENOUS
  Filled 2012-02-13 (×5): qty 2

## 2012-02-13 MED ORDER — HYDROMORPHONE HCL PF 1 MG/ML IJ SOLN
1.0000 mg | INTRAMUSCULAR | Status: DC | PRN
  Administered 2012-02-13 (×3): 1 mg via INTRAVENOUS
  Filled 2012-02-13 (×2): qty 1

## 2012-02-13 MED ORDER — THIAMINE HCL 100 MG/ML IJ SOLN
100.0000 mg | Freq: Every day | INTRAMUSCULAR | Status: DC
  Filled 2012-02-13: qty 1

## 2012-02-13 MED ORDER — HYDROMORPHONE HCL PF 1 MG/ML IJ SOLN
0.5000 mg | INTRAMUSCULAR | Status: DC | PRN
  Administered 2012-02-13 – 2012-02-15 (×15): 0.5 mg via INTRAVENOUS
  Filled 2012-02-13 (×15): qty 1

## 2012-02-13 MED ORDER — LORAZEPAM 2 MG/ML IJ SOLN: 0.0000 mg | Freq: Four times a day (QID) | INTRAMUSCULAR | Status: AC

## 2012-02-13 MED ORDER — ADULT MULTIVITAMIN W/MINERALS CH
1.0000 | ORAL_TABLET | Freq: Every day | ORAL | Status: DC
  Administered 2012-02-13 – 2012-02-15 (×3): 1 via ORAL
  Filled 2012-02-13 (×3): qty 1

## 2012-02-13 MED ORDER — PIPERACILLIN-TAZOBACTAM 3.375 G IVPB: 3.3750 g | Freq: Four times a day (QID) | INTRAVENOUS | Status: DC

## 2012-02-13 MED ORDER — LORAZEPAM 2 MG/ML IJ SOLN: 1.0000 mg | Freq: Four times a day (QID) | INTRAMUSCULAR | Status: DC | PRN

## 2012-02-13 MED ORDER — FOLIC ACID 1 MG PO TABS
1.0000 mg | ORAL_TABLET | Freq: Every day | ORAL | Status: DC
  Administered 2012-02-13 – 2012-02-15 (×3): 1 mg via ORAL
  Filled 2012-02-13 (×3): qty 1

## 2012-02-13 MED ORDER — LORAZEPAM 2 MG/ML IJ SOLN
INTRAMUSCULAR | Status: AC
  Filled 2012-02-13: qty 1

## 2012-02-13 NOTE — Progress Notes (Signed)
Utilization review completed.  

## 2012-02-13 NOTE — Progress Notes (Signed)
Subjective: Still having abd pain, N/V  Objective: Vital signs in last 24 hours: Filed Vitals:   02/12/12 1745 02/12/12 2200 02/13/12 0612 02/13/12 0900  BP: 156/77 151/75 132/72 154/77  Pulse: 70 76 92 70  Temp: 97.7 F (36.5 C) 97.8 F (36.6 C) 98.4 F (36.9 C)   TempSrc: Oral Oral    Resp: 21 20 20    Weight: 52.617 kg (116 lb)     SpO2: 99% 99% 98%    Weight change:   Intake/Output Summary (Last 24 hours) at 02/13/12 1051 Last data filed at 02/13/12 0500  Gross per 24 hour  Intake   1200 ml  Output      0 ml  Net   1200 ml    Physical Exam: General: Awake, Oriented, appears to be in distress HEENT: EOMI. Neck: Supple CV: S1 and S2, RRR Lungs: Clear to ascultation bilaterally, no wheezing Abdomen: Soft, voluntary guarding, Nondistended, +bowel sounds, thin Ext: Good pulses. Trace edema.   Lab Results:  [facility] 02/13/12 0522 02/12/12 1641  NA 129* 130*  K 4.6 4.6  CL 95* 97  CO2 25 23  GLUCOSE 80 82  BUN 4* 4*  CREATININE 0.55 0.53  CALCIUM 8.4 8.3*  MG -- 1.7  PHOS -- 3.5    Basename 02/13/12 0522 02/12/12 1641  AST 14 19  ALT 7 10  ALKPHOS 83 98  BILITOT 0.2* 0.2*  PROT 6.1 6.8  ALBUMIN 2.9* 3.3*    Basename 02/13/12 0522 02/12/12 0128  LIPASE 31 207*  AMYLASE -- --    Alvira Philips 02/13/12 0522 02/12/12 1641 02/12/12 0128  WBC 18.3* 14.9* --  NEUTROABS -- 12.4* 7.7  HGB 12.4* 13.7 --  HCT 36.5* 40.8 --  MCV 83.3 84.8 --  PLT 343 354 --   No results found for this basename: CKTOTAL:3,CKMB:3,CKMBINDEX:3,TROPONINI:3 in the last 72 hours No components found with this basename: POCBNP:3 No results found for this basename: DDIMER:2 in the last 72 hours  Basename 02/12/12 1641  HGBA1C 5.9*   No results found for this basename: CHOL:2,HDL:2,LDLCALC:2,TRIG:2,CHOLHDL:2,LDLDIRECT:2 in the last 72 hours  Basename 02/12/12 1641  TSH 2.547  T4TOTAL --  T3FREE --  THYROIDAB --   No results found for this basename:  VITAMINB12:2,FOLATE:2,FERRITIN:2,TIBC:2,IRON:2,RETICCTPCT:2 in the last 72 hours  Micro Results: No results found for this or any previous visit (from the past 240 hour(s)).  Studies/Results: Ct Abdomen Pelvis W Contrast  02/12/2012  *RADIOLOGY REPORT*  Clinical Data: Mid abdominal pain, pancreatitis  CT ABDOMEN AND PELVIS WITH CONTRAST  Technique:  Multidetector CT imaging of the abdomen and pelvis was performed following the standard protocol during bolus administration of intravenous contrast. Sagittal and coronal MPR images reconstructed from axial data set.  Contrast: 40mL OMNIPAQUE IOHEXOL 300 MG/ML IJ SOLN, OMNIPAQUE IOHEXOL 300 MG/ML IJ SOLN  Comparison: 07/26/2010  Findings: Bibasilar atelectasis. Beam hardening artifacts from metallic foreign bodies posterior to the stomach at the pancreatic tail, by plain films appear to represent embolization coils. Enlargement of pancreas with peripancreatic edema consistent with acute pancreatitis, most severe at the tail. A portion of the pancreas at the tail demonstrates a lack of enhancement raising question of pancreatic necrosis. No pancreatic hemorrhage identified.  Perigastric collaterals with evidence of prior splenic vein thrombosis. Focal fatty infiltration of liver adjacent to falciform fissure. Liver, spleen, and kidneys otherwise normal appearance. Bilateral thickened adrenal glands without discrete mass/nodule. Colon incompletely distended and unopacified, unable to exclude wall thickening. Stomach and small bowel loops normal appearance.  Scattered normal and upper normal-sized peripancreatic and mesenteric lymph nodes without discrete adenopathy. Upper normal-sized retrocrural node 10 x 10 mm image 17. Distended bladder. No mass, hernia, or free air. Small amount of nonspecific free pelvic fluid adjacent to bladder. Normal appendix. No acute osseous findings. Chronic compression fracture of L1, stable.  IMPRESSION: Acute pancreatitis, most  severe at tail. Area of pancreatic tail with absent enhancement suggesting pancreatic necrosis. 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  Original Report Authenticated By: Lollie Marrow, M.D.    Medications: I have reviewed the patient's current medications. Scheduled Meds:   . sodium chloride   Intravenous Once  . sodium chloride   Intravenous STAT  . enoxaparin  30 mg Subcutaneous Q24H  . folic acid  1 mg Oral Daily  .  HYDROmorphone (DILAUDID) injection  1 mg Intravenous Once  .  HYDROmorphone (DILAUDID) injection  1 mg Intravenous Once  .  HYDROmorphone (DILAUDID) injection  1 mg Intravenous Once  .  HYDROmorphone (DILAUDID) injection  1 mg Intravenous Once  . insulin aspart  0-5 Units Subcutaneous QHS  . insulin aspart  0-9 Units Subcutaneous TID WC  . LORazepam      . LORazepam  0-4 mg Intravenous Q6H   Followed by  . LORazepam  0-4 mg Intravenous Q12H  . metoCLOPramide (REGLAN) injection  10 mg Intravenous Once  . mulitivitamin with minerals  1 tablet Oral Daily  . nicotine  21 mg Transdermal Daily  . ondansetron  4 mg Intravenous Once  . sodium chloride  500 mL Intravenous Once  . sodium chloride  500 mL Intravenous Once  . thiamine  100 mg Oral Daily  . DISCONTD: sodium chloride   Intravenous STAT  . DISCONTD: thiamine  100 mg Intravenous Daily  . DISCONTD: thiamine  100 mg Oral Daily   Continuous Infusions:   . sodium chloride 125 mL/hr at 02/13/12 0719   PRN Meds:.albuterol, HYDROmorphone (DILAUDID) injection, HYDROmorphone (DILAUDID) injection, iohexol, iohexol, LORazepam, LORazepam, ondansetron (ZOFRAN) IV, ondansetron (ZOFRAN) IV, oxyCODONE-acetaminophen, DISCONTD: albuterol, DISCONTD:  HYDROmorphone (DILAUDID) injection, DISCONTD:  HYDROmorphone (DILAUDID) injection, DISCONTD:  HYDROmorphone (DILAUDID) injection, DISCONTD: ondansetron (ZOFRAN) IV  Assessment/Plan: Principal Problem:  *Pancreatitis, acute- patient states he has not drank in years,  still with abd pain, NPO, IVF, pain control   Leukocytosis- watch- CT scan suggested pancreatic necrosis, will call surgery- start abx   Hyponatremia- watch- ? Volume overload  Pain- diludid     LOS: 1 day  Lissa Rowles, DO 02/13/2012, 10:51 AM

## 2012-02-13 NOTE — Consult Note (Signed)
History of chronic and relapsing pancreatitis.  Non-surgical management.  Rickey Martin. Rickey Bon, MD, FACS [phone] [facility]

## 2012-02-13 NOTE — Consult Note (Signed)
Rickey Martin 1966-09-19  [id number].   Requesting MD: Dr. Benjamine Mola Chief Complaint/Reason for Consult: pancreatitis HPI: This is a 46 yo white male with a history of alcoholism and chronic pancreatitis who presented to the [facility] on Monday after developing an acute onset of abdominal pain.  This felt similar to his prior episodes of pancreatitis, but slightly worse this time.  He then developed nausea and vomiting.  He stated he started throwing up bilious emesis today.  Denies any hematemesis.  He had a normal BM on Monday.  He was found to have pancreatitis.  A CT scan was completed due to an increasing WBC which questioned some pancreatic tail necrosis.  We have been asked to see him for further recommendations.  Review of Systems: Please see HPI; otherwise all other systems have been reviewed and are negative.  History reviewed. No pertinent family history.  Past Medical History  Diagnosis Date  . Pancreatitis   . AAA (abdominal aortic aneurysm)   . Gastric peptic ulcer   -emphysema   History reviewed. No pertinent past surgical history.  Social History:  reports that he has been smoking.  He does not have any smokeless tobacco history on file. He reports that he drinks alcohol. He reports that he does not use illicit drugs.  Allergies: No Known Allergies  Medications Prior to Admission  Medication Dose Route Frequency Provider Last Rate Last Dose  . 0.9 %  sodium chloride infusion   Intravenous Once [employer], PA-C 125 mL/hr at 02/12/12 1337    . 0.9 %  sodium chloride infusion   Intravenous STAT Shaaron Adler, PA-C 150 mL/hr at 02/13/12 0500    . 0.9 %  sodium chloride infusion   Intravenous Continuous Manson Passey, MD 125 mL/hr at 02/13/12 0719    . albuterol (PROVENTIL HFA;VENTOLIN HFA) 108 (90 BASE) MCG/ACT inhaler 1 puff  1 puff Inhalation Q6H PRN Nishant Dhungel, MD      . enoxaparin (LOVENOX) injection 30 mg  30 mg Subcutaneous Q24H Manson Passey, MD   30 mg  at 02/12/12 2218  . fentaNYL (SUBLIMAZE) injection 100 mcg  100 mcg Intravenous Once April K Palumbo-Rasch, MD   100 mcg at 02/12/12 0401  . folic acid (FOLVITE) tablet 1 mg  1 mg Oral Daily Marlin Canary, DO   1 mg at 02/13/12 0940  . HYDROmorphone (DILAUDID) injection 0.5 mg  0.5 mg Intravenous Q3H PRN Marlin Canary, DO   0.5 mg at 02/13/12 1117  . HYDROmorphone (DILAUDID) injection 1 mg  1 mg Intravenous Once April K Palumbo-Rasch, MD   1 mg at 02/12/12 0139  . HYDROmorphone (DILAUDID) injection 1 mg  1 mg Intravenous Once April K Palumbo-Rasch, MD   1 mg at 02/12/12 0234  . HYDROmorphone (DILAUDID) injection 1 mg  1 mg Intravenous Once April K Palumbo-Rasch, MD   1 mg at 02/12/12 0307  . HYDROmorphone (DILAUDID) injection 1 mg  1 mg Intravenous Once Shaaron Adler, PA-C   1 mg at 02/12/12 1111  . HYDROmorphone (DILAUDID) injection 1 mg  1 mg Intravenous Once Shaaron Adler, PA-C   1 mg at 02/12/12 1136  . HYDROmorphone (DILAUDID) injection 1 mg  1 mg Intravenous Once Shaaron Adler, PA-C   1 mg at 02/12/12 1236  . HYDROmorphone (DILAUDID) injection 1 mg  1 mg Intravenous Once Shaaron Adler, PA-C   1 mg at 02/12/12 1448  . HYDROmorphone (DILAUDID) injection 1 mg  1 mg Intravenous  Q2H PRN Manson Passey, MD   1 mg at 02/13/12 0203  . insulin aspart (novoLOG) injection 0-5 Units  0-5 Units Subcutaneous QHS Manson Passey, MD      . insulin aspart (novoLOG) injection 0-9 Units  0-9 Units Subcutaneous TID WC Manson Passey, MD      . iohexol (OMNIPAQUE) 300 MG/ML solution 100 mL  100 mL Intravenous Once PRN Medication Radiologist, MD   100 mL at 02/12/12 2126  . iohexol (OMNIPAQUE) 300 MG/ML solution 40 mL  40 mL Oral Once PRN Medication Radiologist, MD   40 mL at 02/12/12 2125  . LORazepam (ATIVAN) 2 MG/ML injection           . LORazepam (ATIVAN) injection 0-4 mg  0-4 mg Intravenous Q6H Marlin Canary, DO       Followed by  . LORazepam (ATIVAN) injection 0-4 mg  0-4 mg  Intravenous Q12H Marlin Canary, DO      . LORazepam (ATIVAN) tablet 1 mg  1 mg Oral Q6H PRN Marlin Canary, DO       Or  . LORazepam (ATIVAN) injection 1 mg  1 mg Intravenous Q6H PRN Marlin Canary, DO      . metoCLOPramide (REGLAN) injection 10 mg  10 mg Intravenous Once Shaaron Adler, PA-C   10 mg at 02/12/12 1449  . mulitivitamin with minerals tablet 1 tablet  1 tablet Oral Daily Marlin Canary, DO   1 tablet at 02/13/12 0940  . nicotine (NICODERM CQ - dosed in mg/24 hours) patch 21 mg  21 mg Transdermal Daily Manson Passey, MD   21 mg at 02/13/12 0937  . ondansetron [facility]) injection 4 mg  4 mg Intravenous Once April K Palumbo-Rasch, MD   4 mg at 02/12/12 0138  . ondansetron (ZOFRAN) injection 4 mg  4 mg Intravenous Once Shaaron Adler, PA-C   4 mg at 02/12/12 1110  . ondansetron (ZOFRAN) injection 4 mg  4 mg Intravenous Q6H PRN Shaaron Adler, PA-C   4 mg at 02/13/12 0028  . ondansetron (ZOFRAN) injection 4 mg  4 mg Intravenous Q6H PRN Marlin Canary, DO   4 mg at 02/13/12 0935  . oxyCODONE-acetaminophen (PERCOCET) 5-325 MG per tablet 1 tablet  1 tablet Oral Q4H PRN Manson Passey, MD   1 tablet at 02/13/12 1119  . piperacillin-tazobactam (ZOSYN) IVPB 3.375 g  3.375 g Intravenous Q8H Marlin Canary, DO      . sodium chloride 0.9 % bolus 1,000 mL  1,000 mL Intravenous Once April K Palumbo-Rasch, MD   1,000 mL at 02/12/12 0139  . sodium chloride 0.9 % bolus 1,000 mL  1,000 mL Intravenous Once April K Palumbo-Rasch, MD   1,000 mL at 02/12/12 0351  . sodium chloride 0.9 % bolus 500 mL  500 mL Intravenous Once Shaaron Adler, PA-C   500 mL at 02/12/12 1111  . sodium chloride 0.9 % bolus 500 mL  500 mL Intravenous Once Shaaron Adler, PA-C   500 mL at 02/12/12 1227  . thiamine tablet 100 mg  100 mg Oral Daily Manson Passey, MD   100 mg at 02/13/12 0940  . DISCONTD: 0.9 %  sodium chloride infusion   Intravenous STAT Shaaron Adler, [place]      . DISCONTD:  albuterol (PROVENTIL,VENTOLIN) inhaler 1 puff  1 puff Inhalation Q6H PRN Manson Passey, MD      . DISCONTD: HYDROmorphone (DILAUDID) injection 1 mg  1 mg Intravenous Q2H PRN Shaaron Adler, PA-C      .  DISCONTD: HYDROmorphone (DILAUDID) injection 1 mg  1 mg Intravenous Q2H PRN Shaaron Adler, PA-C      . DISCONTD: HYDROmorphone (DILAUDID) injection 1 mg  1 mg Intravenous Q2H PRN Maren Reamer, NP   1 mg at 02/13/12 0811  . DISCONTD: ondansetron (ZOFRAN) injection 4 mg  4 mg Intravenous Q6H PRN Shaaron Adler, PA-C      . DISCONTD: piperacillin-tazobactam (ZOSYN) IVPB 3.375 g  3.375 g Intravenous Q6H Marlin Canary, DO      . DISCONTD: thiamine (B-1) injection 100 mg  100 mg Intravenous Daily Marlin Canary, DO      . DISCONTD: thiamine (VITAMIN B-1) tablet 100 mg  100 mg Oral Daily Marlin Canary, DO       Medications Prior to Admission  Medication Sig Dispense Refill  . albuterol (PROVENTIL,VENTOLIN) 90 MCG/ACT inhaler Inhale 1 puff into the lungs every 6 (six) hours as needed. Shortness of breath and wheezing      . oxyCODONE-acetaminophen (PERCOCET) 5-325 MG per tablet Take 1 tablet by mouth every 4 (four) hours as needed. Pain        . thiamine 100 MG tablet Take 100 mg by mouth daily.          Blood pressure 155/70, pulse 81, temperature 98.5 F (36.9 C), temperature source Oral, resp. rate 16, weight 116 lb (52.617 kg), SpO2 100.00%. Physical Exam: General: pleasant skinny 46 yo wm who is laying in bed in NAD HEENT: head is normocephalic, atraumatic, sclera are noninjected. PERRL.  Ears and nose without any masses or lesions.  Mouth is pink Heart: regular, rate, and rhythm. No murmurs, gallops, or rubs.  Palpable radial and pedal pulses bilaterally. Lungs: CTAB, no wheezes, rhonchi, or rales noted.  Respiratory effort is nonlabored. Abd: soft, moderately tender in epigastrum, hypoactive BS, ND, no masses or hernias noted. MS: all 4 extremities are symmetrical with no  cyanosis, clubbing, or edema Skin: warm and dry, no obvious masses or rashes Psych: A&O x3, with an appropriate affect.   Results for orders placed during the hospital encounter of 02/12/12 (from the past 48 hour(s))  CBC     Status: Abnormal   Collection Time   02/12/12  4:41 PM      Component Value Range Comment   WBC 14.9 (*) 4.0 - 10.5 (K/uL)    RBC 4.81  4.22 - 5.81 (MIL/uL)    Hemoglobin 13.7  13.0 - 17.0 (g/dL)    HCT 40.9  81.1 - 91.4 (%)    MCV 84.8  78.0 - 100.0 (fL)    MCH 28.5  26.0 - 34.0 (pg)    MCHC 33.6  30.0 - 36.0 (g/dL)    RDW 78.2 (*) 95.6 - 15.5 (%)    Platelets 354  150 - 400 (K/uL)   COMPREHENSIVE METABOLIC PANEL     Status: Abnormal   Collection Time   02/12/12  4:41 PM      Component Value Range Comment   Sodium 130 (*) 135 - 145 (mEq/L)    Potassium 4.6  3.5 - 5.1 (mEq/L)    Chloride 97  96 - 112 (mEq/L)    CO2 23  19 - 32 (mEq/L)    Glucose, Bld 82  70 - 99 (mg/dL)    BUN 4 (*) 6 - 23 (mg/dL)    Creatinine, Ser 2.13  0.50 - 1.35 (mg/dL)    Calcium 8.3 (*) 8.4 - 10.5 (mg/dL)    Total Protein 6.8  6.0 - 8.3 (  g/dL)    Albumin 3.3 (*) 3.5 - 5.2 (g/dL)    AST 19  0 - 37 (U/L)    ALT 10  0 - 53 (U/L)    Alkaline Phosphatase 98  39 - 117 (U/L)    Total Bilirubin 0.2 (*) 0.3 - 1.2 (mg/dL)    GFR calc non Af Amer >90  >90 (mL/min)    GFR calc Af Amer >90  >90 (mL/min)   MAGNESIUM     Status: Normal   Collection Time   02/12/12  4:41 PM      Component Value Range Comment   Magnesium 1.7  1.5 - 2.5 (mg/dL)   PHOSPHORUS     Status: Normal   Collection Time   02/12/12  4:41 PM      Component Value Range Comment   Phosphorus 3.5  2.3 - 4.6 (mg/dL)   DIFFERENTIAL     Status: Abnormal   Collection Time   02/12/12  4:41 PM      Component Value Range Comment   Neutrophils Relative 83 (*) 43 - 77 (%)    Lymphocytes Relative 9 (*) 12 - 46 (%)    Monocytes Relative 8  3 - 12 (%)    Eosinophils Relative 0  0 - 5 (%)    Basophils Relative 0  0 - 1 (%)    Neutro Abs  12.4 (*) 1.7 - 7.7 (K/uL)    Lymphs Abs 1.3  0.7 - 4.0 (K/uL)    Monocytes Absolute 1.2 (*) 0.1 - 1.0 (K/uL)    Eosinophils Absolute 0.0  0.0 - 0.7 (K/uL)    Basophils Absolute 0.0  0.0 - 0.1 (K/uL)    Smear Review MORPHOLOGY UNREMARKABLE     APTT     Status: Normal   Collection Time   02/12/12  4:41 PM      Component Value Range Comment   aPTT 33  24 - 37 (seconds)   PROTIME-INR     Status: Normal   Collection Time   02/12/12  4:41 PM      Component Value Range Comment   Prothrombin Time 13.7  11.6 - 15.2 (seconds)    INR 1.03  0.00 - 1.49    TSH     Status: Normal   Collection Time   02/12/12  4:41 PM      Component Value Range Comment   TSH 2.547  0.350 - 4.500 (uIU/mL)   HEMOGLOBIN A1C     Status: Abnormal   Collection Time   02/12/12  4:41 PM      Component Value Range Comment   Hemoglobin A1C 5.9 (*) <5.7 (%)    Mean Plasma Glucose 123 (*) <117 (mg/dL)   ETHANOL     Status: Normal   Collection Time   02/12/12  4:41 PM      Component Value Range Comment   Alcohol, Ethyl (B) <11  0 - 11 (mg/dL)   GLUCOSE, CAPILLARY     Status: Normal   Collection Time   02/12/12  6:18 PM      Component Value Range Comment   Glucose-Capillary 77  70 - 99 (mg/dL)    Comment 1 Notify RN     URINALYSIS, ROUTINE W REFLEX MICROSCOPIC     Status: Abnormal   Collection Time   02/12/12  6:26 PM      Component Value Range Comment   Color, Urine YELLOW  YELLOW     APPearance CLEAR  CLEAR  Specific Gravity, Urine 1.011  1.005 - 1.030     pH 6.0  5.0 - 8.0     Glucose, UA NEGATIVE  NEGATIVE (mg/dL)    Hgb urine dipstick NEGATIVE  NEGATIVE     Bilirubin Urine NEGATIVE  NEGATIVE     Ketones, ur 40 (*) NEGATIVE (mg/dL)    Protein, ur NEGATIVE  NEGATIVE (mg/dL)    Urobilinogen, UA 0.2  0.0 - 1.0 (mg/dL)    Nitrite NEGATIVE  NEGATIVE     Leukocytes, UA NEGATIVE  NEGATIVE  MICROSCOPIC NOT DONE ON URINES WITH NEGATIVE PROTEIN, BLOOD, LEUKOCYTES, NITRITE, OR GLUCOSE <1000 mg/dL.  URINE RAPID DRUG SCREEN  (HOSP PERFORMED)     Status: Normal   Collection Time   02/12/12  6:27 PM      Component Value Range Comment   Opiates NONE DETECTED  NONE DETECTED     Cocaine NONE DETECTED  NONE DETECTED     Benzodiazepines NONE DETECTED  NONE DETECTED     Amphetamines NONE DETECTED  NONE DETECTED     Tetrahydrocannabinol NONE DETECTED  NONE DETECTED     Barbiturates NONE DETECTED  NONE DETECTED    GLUCOSE, CAPILLARY     Status: Abnormal   Collection Time   02/12/12 10:02 PM      Component Value Range Comment   Glucose-Capillary 60 (*) 70 - 99 (mg/dL)   GLUCOSE, CAPILLARY     Status: Normal   Collection Time   02/12/12 10:46 PM      Component Value Range Comment   Glucose-Capillary 77  70 - 99 (mg/dL)   COMPREHENSIVE METABOLIC PANEL     Status: Abnormal   Collection Time   02/13/12  5:22 AM      Component Value Range Comment   Sodium 129 (*) 135 - 145 (mEq/L)    Potassium 4.6  3.5 - 5.1 (mEq/L)    Chloride 95 (*) 96 - 112 (mEq/L)    CO2 25  19 - 32 (mEq/L)    Glucose, Bld 80  70 - 99 (mg/dL)    BUN 4 (*) 6 - 23 (mg/dL)    Creatinine, Ser 1.61  0.50 - 1.35 (mg/dL)    Calcium 8.4  8.4 - 10.5 (mg/dL)    Total Protein 6.1  6.0 - 8.3 (g/dL)    Albumin 2.9 (*) 3.5 - 5.2 (g/dL)    AST 14  0 - 37 (U/L)    ALT 7  0 - 53 (U/L)    Alkaline Phosphatase 83  39 - 117 (U/L)    Total Bilirubin 0.2 (*) 0.3 - 1.2 (mg/dL)    GFR calc non Af Amer >90  >90 (mL/min)    GFR calc Af Amer >90  >90 (mL/min)   CBC     Status: Abnormal   Collection Time   02/13/12  5:22 AM      Component Value Range Comment   WBC 18.3 (*) 4.0 - 10.5 (K/uL)    RBC 4.38  4.22 - 5.81 (MIL/uL)    Hemoglobin 12.4 (*) 13.0 - 17.0 (g/dL)    HCT 09.6 (*) 04.5 - 52.0 (%)    MCV 83.3  78.0 - 100.0 (fL)    MCH 28.3  26.0 - 34.0 (pg)    MCHC 34.0  30.0 - 36.0 (g/dL)    RDW 40.9 (*) 81.1 - 15.5 (%)    Platelets 343  150 - 400 (K/uL)   LIPASE, BLOOD     Status: Normal  Collection Time   02/13/12  5:22 AM      Component Value Range Comment    Lipase 31  11 - 59 (U/L)   GLUCOSE, CAPILLARY     Status: Normal   Collection Time   02/13/12  9:16 AM      Component Value Range Comment   Glucose-Capillary 72  70 - 99 (mg/dL)    Comment 1 Documented in Chart      Comment 2 Notify RN     GLUCOSE, CAPILLARY     Status: Normal   Collection Time   02/13/12 11:23 AM      Component Value Range Comment   Glucose-Capillary 80  70 - 99 (mg/dL)    Comment 1 Documented in Chart      Comment 2 Notify RN      Ct Abdomen Pelvis W Contrast  02/12/2012  *RADIOLOGY REPORT*  Clinical Data: Mid abdominal pain, pancreatitis  CT ABDOMEN AND PELVIS WITH CONTRAST  Technique:  Multidetector CT imaging of the abdomen and pelvis was performed following the standard protocol during bolus administration of intravenous contrast. Sagittal and coronal MPR images reconstructed from axial data set.  Contrast: 40mL OMNIPAQUE IOHEXOL 300 MG/ML IJ SOLN, OMNIPAQUE IOHEXOL 300 MG/ML IJ SOLN  Comparison: 07/26/2010  Findings: Bibasilar atelectasis. Beam hardening artifacts from metallic foreign bodies posterior to the stomach at the pancreatic tail, by plain films appear to represent embolization coils. Enlargement of pancreas with peripancreatic edema consistent with acute pancreatitis, most severe at the tail. A portion of the pancreas at the tail demonstrates a lack of enhancement raising question of pancreatic necrosis. No pancreatic hemorrhage identified.  Perigastric collaterals with evidence of prior splenic vein thrombosis. Focal fatty infiltration of liver adjacent to falciform fissure. Liver, spleen, and kidneys otherwise normal appearance. Bilateral thickened adrenal glands without discrete mass/nodule. Colon incompletely distended and unopacified, unable to exclude wall thickening. Stomach and small bowel loops normal appearance.  Scattered normal and upper normal-sized peripancreatic and mesenteric lymph nodes without discrete adenopathy. Upper normal-sized retrocrural  node 10 x 10 mm image 17. Distended bladder. No mass, hernia, or free air. Small amount of nonspecific free pelvic fluid adjacent to bladder. Normal appendix. No acute osseous findings. Chronic compression fracture of L1, stable.  IMPRESSION: Acute pancreatitis, most severe at tail. Area of pancreatic tail with absent enhancement suggesting pancreatic necrosis. 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  Original Report Authenticated By: Lollie Marrow, M.D.       Assessment/Plan 1. Acute on chronic pancreatitis (ETOH related) 2. Emphysema  3. History of GI bleed that required embolization 4. AAA, that required stenting  Plan: 1. The patient does have some inflammation of his pancreatic tail.  It is difficult to confirm whether this is true necrosis or not, but he definitely has an acute change visible on CT scan.  The patient otherwise is very stable and having mild pain.  There are no current indications for surgical intervention at this time.  Agree with NPO status and antibiotic therapy.  We will follow. Tam Savoia E 02/13/2012, 2:14 PM

## 2012-02-14 LAB — GLUCOSE, CAPILLARY
Glucose-Capillary: 83 mg/dL (ref 70–99)
Glucose-Capillary: 98 mg/dL (ref 70–99)

## 2012-02-14 LAB — CBC
HCT: 38.6 % — ABNORMAL LOW (ref 39.0–52.0)
MCV: 84.1 fL (ref 78.0–100.0)
RBC: 4.59 MIL/uL (ref 4.22–5.81)
WBC: 17.7 10*3/uL — ABNORMAL HIGH (ref 4.0–10.5)

## 2012-02-14 LAB — COMPREHENSIVE METABOLIC PANEL
AST: 9 U/L (ref 0–37)
Albumin: 2.5 g/dL — ABNORMAL LOW (ref 3.5–5.2)
Alkaline Phosphatase: 80 U/L (ref 39–117)
BUN: 8 mg/dL (ref 6–23)
CO2: 26 mEq/L (ref 19–32)
Chloride: 97 mEq/L (ref 96–112)
Creatinine, Ser: 0.67 mg/dL (ref 0.50–1.35)
GFR calc non Af Amer: >90 mL/min (ref >90)
Potassium: 5.2 mEq/L — ABNORMAL HIGH (ref 3.5–5.1)
Total Bilirubin: 0.3 mg/dL (ref 0.3–1.2)

## 2012-02-14 LAB — LIPASE, BLOOD: Lipase: 12 U/L (ref 11–59)

## 2012-02-14 MED ORDER — PANTOPRAZOLE SODIUM 40 MG PO TBEC
40.0000 mg | DELAYED_RELEASE_TABLET | Freq: Every day | ORAL | Status: DC
  Administered 2012-02-14: 40 mg via ORAL
  Filled 2012-02-14: qty 1

## 2012-02-14 MED ORDER — PANTOPRAZOLE SODIUM 40 MG IV SOLR
40.0000 mg | INTRAVENOUS | Status: DC
  Filled 2012-02-14: qty 40

## 2012-02-14 NOTE — Progress Notes (Signed)
Subjective: Patient feeling much better today Thinks he can tolerate clears   Objective: Vital signs in last 24 hours: Filed Vitals:   02/13/12 2211 02/14/12 0028 02/14/12 0548 02/14/12 0800  BP: 150/76  150/93 150/90  Pulse: 75  84 82  Temp: 98.4 F (36.9 C)  98.1 F (36.7 C)   TempSrc:      Resp: 18  18   Height:  5\' 5"  (1.651 m)    Weight:  53.524 kg (118 lb)    SpO2: 98%  99%    Weight change: 0.907 kg (2 lb)  Intake/Output Summary (Last 24 hours) at 02/14/12 1333 Last data filed at 02/14/12 0500  Gross per 24 hour  Intake 3564.58 ml  Output      0 ml  Net 3564.58 ml    Physical Exam: General: Awake, Oriented, much more relaxed today HEENT: EOMI. Neck: Supple CV: S1 and S2, RRR Lungs: Clear to ascultation bilaterally, no wheezing Abdomen: Soft, voluntary guarding, Nondistended, +bowel sounds, thin Ext: Good pulses. Trace edema.   Lab Results:  Basename 02/14/12 0540 02/13/12 0522 02/12/12 1641  NA 135 129* --  K 5.2* 4.6 --  CL 97 95* --  CO2 26 25 --  GLUCOSE 72 80 --  BUN 8 4* --  CREATININE 0.67 0.55 --  CALCIUM 8.6 8.4 --  MG -- -- 1.7  PHOS -- -- 3.5    Basename 02/14/12 0540 02/13/12 0522  AST 9 14  ALT 5 7  ALKPHOS 80 83  BILITOT 0.3 0.2*  PROT 6.0 6.1  ALBUMIN 2.5* 2.9*    Basename 02/14/12 0540 02/13/12 0522  LIPASE 12 31  AMYLASE -- --    Basename 02/14/12 0540 02/13/12 0522 02/12/12 1641 02/12/12 0128  WBC 17.7* 18.3* -- --  NEUTROABS -- -- 12.4* 7.7  HGB 12.9* 12.4* -- --  HCT 38.6* 36.5* -- --  MCV 84.1 83.3 -- --  PLT 381 343 -- --   No results found for this basename: CKTOTAL:3,CKMB:3,CKMBINDEX:3,TROPONINI:3 in the last 72 hours No components found with this basename: POCBNP:3 No results found for this basename: DDIMER:2 in the last 72 hours  Basename 02/12/12 1641  HGBA1C 5.9*   No results found for this basename: CHOL:2,HDL:2,LDLCALC:2,TRIG:2,CHOLHDL:2,LDLDIRECT:2 in the last 72 hours  Basename 02/12/12 1641    TSH 2.547  T4TOTAL --  T3FREE --  THYROIDAB --   No results found for this basename: VITAMINB12:2,FOLATE:2,FERRITIN:2,TIBC:2,IRON:2,RETICCTPCT:2 in the last 72 hours  Micro Results: Recent Results (from the past 240 hour(s))  URINE CULTURE     Status: Normal   Collection Time   02/12/12  6:27 PM      Component Value Range Status Comment   Specimen Description URINE, CLEAN CATCH   Final    Special Requests NONE   Final    Culture  Setup Time [id number]   Final    Colony Count NO GROWTH   Final    Culture NO GROWTH   Final    Report Status 02/13/2012 FINAL   Final     Studies/Results: Ct Abdomen Pelvis W Contrast  02/12/2012  *RADIOLOGY REPORT*  Clinical Data: Mid abdominal pain, pancreatitis  CT ABDOMEN AND PELVIS WITH CONTRAST  Technique:  Multidetector CT imaging of the abdomen and pelvis was performed following the standard protocol during bolus administration of intravenous contrast. Sagittal and coronal MPR images reconstructed from axial data set.  Contrast: 40mL OMNIPAQUE IOHEXOL 300 MG/ML IJ SOLN, OMNIPAQUE IOHEXOL 300 MG/ML IJ SOLN  Comparison:  07/26/2010  Findings: Bibasilar atelectasis. Beam hardening artifacts from metallic foreign bodies posterior to the stomach at the pancreatic tail, by plain films appear to represent embolization coils. Enlargement of pancreas with peripancreatic edema consistent with acute pancreatitis, most severe at the tail. A portion of the pancreas at the tail demonstrates a lack of enhancement raising question of pancreatic necrosis. No pancreatic hemorrhage identified.  Perigastric collaterals with evidence of prior splenic vein thrombosis. Focal fatty infiltration of liver adjacent to falciform fissure. Liver, spleen, and kidneys otherwise normal appearance. Bilateral thickened adrenal glands without discrete mass/nodule. Colon incompletely distended and unopacified, unable to exclude wall thickening. Stomach and small bowel loops normal  appearance.  Scattered normal and upper normal-sized peripancreatic and mesenteric lymph nodes without discrete adenopathy. Upper normal-sized retrocrural node 10 x 10 mm image 17. Distended bladder. No mass, hernia, or free air. Small amount of nonspecific free pelvic fluid adjacent to bladder. Normal appendix. No acute osseous findings. Chronic compression fracture of L1, stable.  IMPRESSION: Acute pancreatitis, most severe at tail. Area of pancreatic tail with absent enhancement suggesting pancreatic necrosis. 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  Original Report Authenticated By: Lollie Marrow, M.D.    Medications: I have reviewed the patient's current medications. Scheduled Meds:    . enoxaparin  30 mg Subcutaneous Q24H  . folic acid  1 mg Oral Daily  . insulin aspart  0-5 Units Subcutaneous QHS  . insulin aspart  0-9 Units Subcutaneous TID WC  . LORazepam      . LORazepam  0-4 mg Intravenous Q6H   Followed by  . LORazepam  0-4 mg Intravenous Q12H  . mulitivitamin with minerals  1 tablet Oral Daily  . nicotine  21 mg Transdermal Daily  . pantoprazole  40 mg Oral Q1200  . piperacillin-tazobactam (ZOSYN)  IV  3.375 g Intravenous Q8H  . thiamine  100 mg Oral Daily  . DISCONTD: pantoprazole (PROTONIX) IV  40 mg Intravenous Q24H   Continuous Infusions:    . sodium chloride 125 mL/hr at 02/14/12 0338   PRN Meds:.albuterol, HYDROmorphone (DILAUDID) injection, LORazepam, LORazepam, ondansetron (ZOFRAN) IV, oxyCODONE-acetaminophen  Assessment/Plan: Principal Problem:  *Pancreatitis, acute- patient states he has not drank in years, decreased abd pain, start clears and advance, IVF, pain control   Leukocytosis- watch- abx started   Hyponatremia- watch- ? Volume overload  Pain- diludid, PO alternate, tomm will decrease pain medications if tolerating food     LOS: 2 days  Taji Sather, DO 02/14/2012, 1:33 PM

## 2012-02-14 NOTE — Progress Notes (Signed)
Not a surgical problem.  Unless he becomes septic or develops a pseudocyst, pancreatitis should be treated conservatively.  Marta Lamas. Gae Bon, MD, FACS [phone] [facility]

## 2012-02-14 NOTE — Progress Notes (Signed)
Patient ID: Rickey Martin, male   DOB: January 16, 1966, 46 y.o.   MRN: [id number]    Subjective: Pt feeling much better today.  Pain lessened and decrease nausea with no further emesis.  Objective: Vital signs in last 24 hours: Temp:  [98.1 F (36.7 C)-98.5 F (36.9 C)] 98.1 F (36.7 C) (03/07 0548) Pulse Rate:  [70-84] 84  (03/07 0548) Resp:  [16-18] 18  (03/07 0548) BP: (150-155)/(70-93) 150/93 mmHg (03/07 0548) SpO2:  [98 %-100 %] 99 % (03/07 0548) Weight:  [118 lb (53.524 kg)] 118 lb (53.524 kg) (03/07 0028) Last BM Date: 02/11/12  Intake/Output from previous day: 03/06 0701 - 03/07 0700 In: 3564.6 [I.V.:3564.6] Out: -  Intake/Output this shift:    PE: Abd: soft, less tender, +BS, ND  Lab Results:   [facility] 02/14/12 0540 02/13/12 0522  WBC 17.7* 18.3*  HGB 12.9* 12.4*  HCT 38.6* 36.5*  PLT 381 343   BMET  Basename 02/14/12 0540 02/13/12 0522  NA 135 129*  K 5.2* 4.6  CL 97 95*  CO2 26 25  GLUCOSE 72 80  BUN 8 4*  CREATININE 0.67 0.55  CALCIUM 8.6 8.4   PT/INR  Basename 02/12/12 1641  LABPROT 13.7  INR 1.03     Studies/Results: Ct Abdomen Pelvis W Contrast  02/12/2012  *RADIOLOGY REPORT*  Clinical Data: Mid abdominal pain, pancreatitis  CT ABDOMEN AND PELVIS WITH CONTRAST  Technique:  Multidetector CT imaging of the abdomen and pelvis was performed following the standard protocol during bolus administration of intravenous contrast. Sagittal and coronal MPR images reconstructed from axial data set.  Contrast: 40mL OMNIPAQUE IOHEXOL 300 MG/ML IJ SOLN, OMNIPAQUE IOHEXOL 300 MG/ML IJ SOLN  Comparison: 07/26/2010  Findings: Bibasilar atelectasis. Beam hardening artifacts from metallic foreign bodies posterior to the stomach at the pancreatic tail, by plain films appear to represent embolization coils. Enlargement of pancreas with peripancreatic edema consistent with acute pancreatitis, most severe at the tail. A portion of the pancreas at the tail demonstrates  a lack of enhancement raising question of pancreatic necrosis. No pancreatic hemorrhage identified.  Perigastric collaterals with evidence of prior splenic vein thrombosis. Focal fatty infiltration of liver adjacent to falciform fissure. Liver, spleen, and kidneys otherwise normal appearance. Bilateral thickened adrenal glands without discrete mass/nodule. Colon incompletely distended and unopacified, unable to exclude wall thickening. Stomach and small bowel loops normal appearance.  Scattered normal and upper normal-sized peripancreatic and mesenteric lymph nodes without discrete adenopathy. Upper normal-sized retrocrural node 10 x 10 mm image 17. Distended bladder. No mass, hernia, or free air. Small amount of nonspecific free pelvic fluid adjacent to bladder. Normal appendix. No acute osseous findings. Chronic compression fracture of L1, stable.  IMPRESSION: Acute pancreatitis, most severe at tail. Area of pancreatic tail with absent enhancement suggesting pancreatic necrosis. 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  Original Report Authenticated By: Lollie Marrow, M.D.    Anti-infectives: Anti-infectives     Start     Dose/Rate Route Frequency Ordered Stop   02/13/12 1400  piperacillin-tazobactam (ZOSYN) IVPB 3.375 g       3.375 g 12.5 mL/hr over 240 Minutes Intravenous 3 times per day 02/13/12 1116     02/13/12 1200   piperacillin-tazobactam (ZOSYN) IVPB 3.375 g  Status:  Discontinued        3.375 g 12.5 mL/hr over 240 Minutes Intravenous 4 times per day 02/13/12 1059 02/13/12 1115           Assessment/Plan  1. Pancreatitis  secondary to ETOH  Plan: 1. Patient is improving and nontoxic.  No surgical intervention needed.  Continue with conservative management.  Please call if needed.   LOS: 2 days    Onedia Vargus E 02/14/2012

## 2012-02-15 LAB — BASIC METABOLIC PANEL
CO2: 28 mEq/L (ref 19–32)
Calcium: 8.3 mg/dL — ABNORMAL LOW (ref 8.4–10.5)
Chloride: 97 mEq/L (ref 96–112)
Glucose, Bld: 99 mg/dL (ref 70–99)
Sodium: 134 mEq/L — ABNORMAL LOW (ref 135–145)

## 2012-02-15 LAB — CBC
Hemoglobin: 12.2 g/dL — ABNORMAL LOW (ref 13.0–17.0)
MCH: 28.3 pg (ref 26.0–34.0)
MCV: 84.5 fL (ref 78.0–100.0)
Platelets: 375 10*3/uL (ref 150–400)
RBC: 4.31 MIL/uL (ref 4.22–5.81)
WBC: 14 10*3/uL — ABNORMAL HIGH (ref 4.0–10.5)

## 2012-02-15 LAB — LIPASE, BLOOD: Lipase: 12 U/L (ref 11–59)

## 2012-02-15 MED ORDER — ADULT MULTIVITAMIN W/MINERALS CH: 1.0000 | ORAL_TABLET | Freq: Every day | ORAL | Status: DC

## 2012-02-15 MED ORDER — OXYCODONE-ACETAMINOPHEN 5-325 MG PO TABS: 1.0000 | ORAL_TABLET | ORAL | Status: AC | PRN

## 2012-02-15 MED ORDER — CIPROFLOXACIN HCL 500 MG PO TABS: 500.0000 mg | ORAL_TABLET | Freq: Two times a day (BID) | ORAL | Status: AC

## 2012-02-15 NOTE — Discharge Summary (Addendum)
Discharge Summary  Rickey Martin MR#: [id number]  DOB:1966-07-18  Date of Admission: 02/12/2012 Date of Discharge: 02/15/2012  Patient's PCP: Georganna Skeans, MD, MD  Attending Physician:Bertrum Helmstetter  Consults:   sugery  Discharge Diagnoses:   *Pancreatitis, acute  Leukocytosis  Hyponatremia- resolved  Hyperglycemia  Brief Admitting History and Physical 46 year old male with history of chronic alcohol abuse, chronic pancreatitis who presented to [facility] with complaints of worsening abdominal pain, associated with nausea and vomiting. Evaluation in [facility] revealed the patient has pancreatitis but patient left AGAINST MEDICAL ADVICE from there only to come back to [facility] for evaluation. At present patient complains of periumbilical abdominal pain, nonradiating, 7-8/10 in intensity, associated with nausea and vomiting. Patient had 2 episodes of vomiting prior to the admission, non bloody. There are no complaints of blood in the stool or urine. There are no complaints of fever, chills. No complaints of chest pain, shortness of breath, diaphoresis. No complaints of lightheadedness, dizziness or loss of consciousness   Discharge Medications Medication List  As of 02/15/2012  9:11 AM   TAKE these medications         albuterol 90 MCG/ACT inhaler   Commonly known as: PROVENTIL,VENTOLIN   Inhale 1 puff into the lungs every 6 (six) hours as needed. Shortness of breath and wheezing      ciprofloxacin 500 MG tablet   Commonly known as: CIPRO   Take 1 tablet (500 mg total) by mouth 2 (two) times daily.      mulitivitamin with minerals Tabs   Take 1 tablet by mouth daily.      oxyCODONE-acetaminophen 5-325 MG per tablet   Commonly known as: PERCOCET   Take 1 tablet by mouth every 4 (four) hours as needed (for breakthrough pain).      thiamine 100 MG tablet   Take 100 mg by mouth daily.            Hospital Course: Pancreatitis, acute- patient  states he has not drank in years, tolerating PO and on a PO pain medication regimin Leukocytosis- ? Area of necrosis on CT scan- no pseudocyst- seen by surgery who said conservative pancreatitis management unless patient become septic or develops pseudocyst- will treat with antibiotics for 7 days Hyponatremia- resolved with IVF Pain- tolerating a PO pain regimine  Day of Discharge BP 151/85  Pulse 65  Temp(Src) 97.9 F (36.6 C) (Oral)  Resp 18  Ht 5\' 5"  (1.651 m)  Wt 53.524 kg (118 lb)  BMI 19.64 kg/m2  SpO2 97% Physical Exam: A+ O x 3 NAD Lungs CTA x 2 Abd: soft, NT/ND, + BS -c/c/e  Results for orders placed during the hospital encounter of 02/12/12 (from the past 48 hour(s))  GLUCOSE, CAPILLARY     Status: Normal   Collection Time   02/13/12  9:16 AM      Component Value Range Comment   Glucose-Capillary 72  70 - 99 (mg/dL)    Comment 1 Documented in Chart      Comment 2 Notify RN     GLUCOSE, CAPILLARY     Status: Normal   Collection Time   02/13/12 11:23 AM      Component Value Range Comment   Glucose-Capillary 80  70 - 99 (mg/dL)    Comment 1 Documented in Chart      Comment 2 Notify RN     GLUCOSE, CAPILLARY     Status: Normal   Collection Time   02/13/12  4:27 PM      Component Value Range Comment   Glucose-Capillary 75  70 - 99 (mg/dL)    Comment 1 Notify RN     GLUCOSE, CAPILLARY     Status: Normal   Collection Time   02/13/12  9:25 PM      Component Value Range Comment   Glucose-Capillary 80  70 - 99 (mg/dL)   GLUCOSE, CAPILLARY     Status: Normal   Collection Time   02/14/12  5:37 AM      Component Value Range Comment   Glucose-Capillary 71  70 - 99 (mg/dL)   LIPASE, BLOOD     Status: Normal   Collection Time   02/14/12  5:40 AM      Component Value Range Comment   Lipase 12  11 - 59 (U/L)   CBC     Status: Abnormal   Collection Time   02/14/12  5:40 AM      Component Value Range Comment   WBC 17.7 (*) 4.0 - 10.5 (K/uL)    RBC 4.59  4.22 - 5.81 (MIL/uL)     Hemoglobin 12.9 (*) 13.0 - 17.0 (g/dL)    HCT 16.1 (*) 09.6 - 52.0 (%)    MCV 84.1  78.0 - 100.0 (fL)    MCH 28.1  26.0 - 34.0 (pg)    MCHC 33.4  30.0 - 36.0 (g/dL)    RDW 04.5 (*) 40.9 - 15.5 (%)    Platelets 381  150 - 400 (K/uL)   COMPREHENSIVE METABOLIC PANEL     Status: Abnormal   Collection Time   02/14/12  5:40 AM      Component Value Range Comment   Sodium 135  135 - 145 (mEq/L)    Potassium 5.2 (*) 3.5 - 5.1 (mEq/L)    Chloride 97  96 - 112 (mEq/L)    CO2 26  19 - 32 (mEq/L)    Glucose, Bld 72  70 - 99 (mg/dL)    BUN 8  6 - 23 (mg/dL)    Creatinine, Ser 8.11  0.50 - 1.35 (mg/dL)    Calcium 8.6  8.4 - 10.5 (mg/dL)    Total Protein 6.0  6.0 - 8.3 (g/dL)    Albumin 2.5 (*) 3.5 - 5.2 (g/dL)    AST 9  0 - 37 (U/L)    ALT 5  0 - 53 (U/L)    Alkaline Phosphatase 80  39 - 117 (U/L)    Total Bilirubin 0.3  0.3 - 1.2 (mg/dL)    GFR calc non Af Amer >90  >90 (mL/min)    GFR calc Af Amer >90  >90 (mL/min)   GLUCOSE, CAPILLARY     Status: Normal   Collection Time   02/14/12  7:08 AM      Component Value Range Comment   Glucose-Capillary 72  70 - 99 (mg/dL)   GLUCOSE, CAPILLARY     Status: Normal   Collection Time   02/14/12 11:24 AM      Component Value Range Comment   Glucose-Capillary 83  70 - 99 (mg/dL)    Comment 1 Notify RN     GLUCOSE, CAPILLARY     Status: Normal   Collection Time   02/14/12  4:42 PM      Component Value Range Comment   Glucose-Capillary 86  70 - 99 (mg/dL)   GLUCOSE, CAPILLARY     Status: Normal   Collection Time   02/14/12  9:15 PM  Component Value Range Comment   Glucose-Capillary 98  70 - 99 (mg/dL)    Comment 1 Notify RN     LIPASE, BLOOD     Status: Normal   Collection Time   02/15/12  5:15 AM      Component Value Range Comment   Lipase 12  11 - 59 (U/L)   GLUCOSE, CAPILLARY     Status: Normal   Collection Time   02/15/12  6:35 AM      Component Value Range Comment   Glucose-Capillary 87  70 - 99 (mg/dL)     Ct Abdomen Pelvis W  Contrast  02/12/2012  *RADIOLOGY REPORT*  Clinical Data: Mid abdominal pain, pancreatitis  CT ABDOMEN AND PELVIS WITH CONTRAST  Technique:  Multidetector CT imaging of the abdomen and pelvis was performed following the standard protocol during bolus administration of intravenous contrast. Sagittal and coronal MPR images reconstructed from axial data set.  Contrast: 40mL OMNIPAQUE IOHEXOL 300 MG/ML IJ SOLN, OMNIPAQUE IOHEXOL 300 MG/ML IJ SOLN  Comparison: 07/26/2010  Findings: Bibasilar atelectasis. Beam hardening artifacts from metallic foreign bodies posterior to the stomach at the pancreatic tail, by plain films appear to represent embolization coils. Enlargement of pancreas with peripancreatic edema consistent with acute pancreatitis, most severe at the tail. A portion of the pancreas at the tail demonstrates a lack of enhancement raising question of pancreatic necrosis. No pancreatic hemorrhage identified.  Perigastric collaterals with evidence of prior splenic vein thrombosis. Focal fatty infiltration of liver adjacent to falciform fissure. Liver, spleen, and kidneys otherwise normal appearance. Bilateral thickened adrenal glands without discrete mass/nodule. Colon incompletely distended and unopacified, unable to exclude wall thickening. Stomach and small bowel loops normal appearance.  Scattered normal and upper normal-sized peripancreatic and mesenteric lymph nodes without discrete adenopathy. Upper normal-sized retrocrural node 10 x 10 mm image 17. Distended bladder. No mass, hernia, or free air. Small amount of nonspecific free pelvic fluid adjacent to bladder. Normal appendix. No acute osseous findings. Chronic compression fracture of L1, stable.  IMPRESSION: Acute pancreatitis, most severe at tail. Area of pancreatic tail with absent enhancement suggesting pancreatic necrosis. 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  Original Report Authenticated By: Lollie Marrow, M.D.      Disposition: home  Diet: advance as tolerated  Activity: as tolerated  Follow-up Appts: PCP: [facility] 2 weeks  Discharge Orders    Future Orders Please Complete By Expires   Diet - low sodium heart healthy      Diet Carb Modified      Increase activity slowly      Discharge instructions      Comments:   Avoid alcohol Stop smoking Return to ER with fevers/chills       Time spent on discharge, talking to the patient, and coordinating care: 40 mins.   SignedMarlin Canary, DO 02/15/2012, 9:11 AM

## 2013-06-08 ENCOUNTER — Emergency Department (HOSPITAL_BASED_OUTPATIENT_CLINIC_OR_DEPARTMENT_OTHER)
Admission: EM | Admit: 2013-06-08 | Discharge: 2013-06-08 | Disposition: A | Discharge status: Home / Self Care | Payer: 59 | Attending: Emergency Medicine | Admitting: Emergency Medicine

## 2013-06-08 ENCOUNTER — Encounter (HOSPITAL_BASED_OUTPATIENT_CLINIC_OR_DEPARTMENT_OTHER): Payer: Self-pay | Admitting: Emergency Medicine

## 2013-06-08 ENCOUNTER — Emergency Department (HOSPITAL_BASED_OUTPATIENT_CLINIC_OR_DEPARTMENT_OTHER): Payer: 59

## 2013-06-08 DIAGNOSIS — Z8679 Personal history of other diseases of the circulatory system: Secondary | ICD-10-CM | POA: Insufficient documentation

## 2013-06-08 DIAGNOSIS — Z8711 Personal history of peptic ulcer disease: Secondary | ICD-10-CM | POA: Insufficient documentation

## 2013-06-08 DIAGNOSIS — Z79899 Other long term (current) drug therapy: Secondary | ICD-10-CM | POA: Insufficient documentation

## 2013-06-08 DIAGNOSIS — Z872 Personal history of diseases of the skin and subcutaneous tissue: Secondary | ICD-10-CM | POA: Insufficient documentation

## 2013-06-08 DIAGNOSIS — F17200 Nicotine dependence, unspecified, uncomplicated: Secondary | ICD-10-CM | POA: Insufficient documentation

## 2013-06-08 DIAGNOSIS — K859 Acute pancreatitis without necrosis or infection, unspecified: Secondary | ICD-10-CM | POA: Insufficient documentation

## 2013-06-08 DIAGNOSIS — R6883 Chills (without fever): Secondary | ICD-10-CM | POA: Insufficient documentation

## 2013-06-08 DIAGNOSIS — R112 Nausea with vomiting, unspecified: Secondary | ICD-10-CM | POA: Insufficient documentation

## 2013-06-08 LAB — COMPREHENSIVE METABOLIC PANEL
ALT: 13 U/L (ref 0–53)
BUN: 10 mg/dL (ref 6–23)
CO2: 23 mEq/L (ref 19–32)
Calcium: 10 mg/dL (ref 8.4–10.5)
Creatinine, Ser: 0.8 mg/dL (ref 0.50–1.35)
GFR calc Af Amer: >90 mL/min (ref >90)
GFR calc non Af Amer: >90 mL/min (ref >90)
Glucose, Bld: 100 mg/dL — ABNORMAL HIGH (ref 70–99)
Sodium: 141 mEq/L (ref 135–145)
Total Protein: 8.3 g/dL (ref 6.0–8.3)

## 2013-06-08 LAB — CBC WITH DIFFERENTIAL/PLATELET
Eosinophils Absolute: 0 10*3/uL (ref 0.0–0.7)
Eosinophils Relative: 0 % (ref 0–5)
HCT: 46 % (ref 39.0–52.0)
Lymphocytes Relative: 14 % (ref 12–46)
Lymphs Abs: 1.9 10*3/uL (ref 0.7–4.0)
MCH: 32.6 pg (ref 26.0–34.0)
MCV: 93.7 fL (ref 78.0–100.0)
Monocytes Absolute: 1.1 10*3/uL — ABNORMAL HIGH (ref 0.1–1.0)
Monocytes Relative: 8 % (ref 3–12)
RBC: 4.91 MIL/uL (ref 4.22–5.81)
WBC: 13.4 10*3/uL — ABNORMAL HIGH (ref 4.0–10.5)

## 2013-06-08 LAB — URINALYSIS, ROUTINE W REFLEX MICROSCOPIC
Hgb urine dipstick: NEGATIVE
Nitrite: NEGATIVE
Protein, ur: NEGATIVE mg/dL
Urobilinogen, UA: 0.2 mg/dL (ref 0.0–1.0)

## 2013-06-08 LAB — LIPASE, BLOOD: Lipase: 749 U/L — ABNORMAL HIGH (ref 11–59)

## 2013-06-08 LAB — CG4 I-STAT (LACTIC ACID): Lactic Acid, Venous: 1.25 mmol/L (ref 0.5–2.2)

## 2013-06-08 MED ORDER — SODIUM CHLORIDE 0.9 % IV BOLUS (SEPSIS)
1000.0000 mL | Freq: Once | INTRAVENOUS | Status: AC
  Administered 2013-06-08: 1000 mL via INTRAVENOUS

## 2013-06-08 MED ORDER — HYDROMORPHONE HCL PF 1 MG/ML IJ SOLN
1.0000 mg | Freq: Once | INTRAMUSCULAR | Status: AC
  Administered 2013-06-08: 1 mg via INTRAVENOUS
  Filled 2013-06-08: qty 1

## 2013-06-08 MED ORDER — ONDANSETRON 4 MG PO TBDP: 4.0000 mg | ORAL_TABLET | Freq: Three times a day (TID) | ORAL | Status: DC | PRN

## 2013-06-08 MED ORDER — IOHEXOL 300 MG/ML  SOLN
50.0000 mL | Freq: Once | INTRAMUSCULAR | Status: AC | PRN
  Administered 2013-06-08: 50 mL via ORAL

## 2013-06-08 MED ORDER — ONDANSETRON HCL 4 MG/2ML IJ SOLN
4.0000 mg | Freq: Once | INTRAMUSCULAR | Status: AC
  Administered 2013-06-08: 4 mg via INTRAVENOUS
  Filled 2013-06-08 (×2): qty 2

## 2013-06-08 MED ORDER — IOHEXOL 300 MG/ML  SOLN
100.0000 mL | Freq: Once | INTRAMUSCULAR | Status: AC | PRN
  Administered 2013-06-08: 100 mL via INTRAVENOUS

## 2013-06-08 MED ORDER — HYDROMORPHONE HCL PF 1 MG/ML IJ SOLN
1.0000 mg | Freq: Once | INTRAMUSCULAR | Status: AC
  Administered 2013-06-08: 1 mg via INTRAVENOUS

## 2013-06-08 MED ORDER — ONDANSETRON HCL 4 MG/2ML IJ SOLN
4.0000 mg | Freq: Once | INTRAMUSCULAR | Status: AC
  Administered 2013-06-08: 4 mg via INTRAVENOUS

## 2013-06-08 MED ORDER — PROMETHAZINE HCL 25 MG/ML IJ SOLN
12.5000 mg | Freq: Once | INTRAMUSCULAR | Status: AC
  Administered 2013-06-08: 12.5 mg via INTRAVENOUS
  Filled 2013-06-08: qty 1

## 2013-06-08 MED ORDER — HYDROMORPHONE HCL PF 1 MG/ML IJ SOLN
1.0000 mg | Freq: Once | INTRAMUSCULAR | Status: AC
  Administered 2013-06-08: 1 mg via INTRAVENOUS
  Filled 2013-06-08 (×2): qty 1

## 2013-06-08 MED ORDER — OXYCODONE-ACETAMINOPHEN 5-325 MG PO TABS: 2.0000 | ORAL_TABLET | ORAL | Status: DC | PRN

## 2013-06-08 NOTE — ED Provider Notes (Signed)
History    CSN: [id number] Arrival date & time 06/08/13  1503  First MD Initiated Contact with Patient 06/08/13 1538     Chief Complaint  Patient presents with  . Abdominal Pain  . Emesis   (Consider location/radiation/quality/duration/timing/severity/associated sxs/prior Treatment) Patient is a 47 y.o. male presenting with vomiting.  Emesis Pt has a history of pancreatitis,    Past Medical History  Diagnosis Date  . Pancreatitis   . AAA (abdominal aortic aneurysm)   . Gastric peptic ulcer   . Ulcer    Past Surgical History  Procedure Laterality Date  . Abdominal aortic aneurysm repair     No family history on file. History  Substance Use Topics  . Smoking status: Current Every Day Smoker -- 1.50 packs/day  . Smokeless tobacco: Not on file  . Alcohol Use: Yes     Comment: states no etoh since june 2012    Review of Systems  Gastrointestinal: Positive for vomiting.    Allergies  Review of patient's allergies indicates no known allergies.  Home Medications   Current Outpatient Rx  Name  Route  Sig  Dispense  Refill  . albuterol (PROVENTIL,VENTOLIN) 90 MCG/ACT inhaler   Inhalation   Inhale 1 puff into the lungs every 6 (six) hours as needed. Shortness of breath and wheezing         . Multiple Vitamin (MULITIVITAMIN WITH MINERALS) TABS   Oral   Take 1 tablet by mouth daily.         Marland Kitchen thiamine 100 MG tablet   Oral   Take 100 mg by mouth daily.            BP 139/83  Pulse 68  Temp(Src) 98 F (36.7 C) (Oral)  Resp 16  Ht 5\' 8"  (1.727 m)  Wt 120 lb (54.432 kg)  BMI 18.25 kg/m2  SpO2 97% Physical Exam  ED Course  Procedures (including critical care time) Labs Reviewed  CBC WITH DIFFERENTIAL - Abnormal; Notable for the following:    WBC 13.4 (*)    Neutro Abs 10.4 (*)    Monocytes Absolute 1.1 (*)    All other components within normal limits  COMPREHENSIVE METABOLIC PANEL - Abnormal; Notable for the following:    Glucose, Bld 100 (*)     All other components within normal limits  LIPASE, BLOOD - Abnormal; Notable for the following:    Lipase 749 (*)    All other components within normal limits  URINALYSIS, ROUTINE W REFLEX MICROSCOPIC - Abnormal; Notable for the following:    Bilirubin Urine SMALL (*)    Ketones, ur >80 (*)    All other components within normal limits  CG4 I-STAT (LACTIC ACID)   Ct Abdomen Pelvis W Contrast  06/08/2013   *RADIOLOGY REPORT*  Clinical Data: Abdominal pain, vomiting.  History of AAA repair, pancreatitis, peptic ulcer.  CT ABDOMEN AND PELVIS WITH CONTRAST  Technique:  Multidetector CT imaging of the abdomen and pelvis was performed following the standard protocol during bolus administration of intravenous contrast.  Contrast: 100 ml Omnipaque-300 IV  Comparison: 02/12/2012  Findings: Lung bases are clear.  Liver, spleen, and adrenal glands are within normal limits.  Peripancreatic inflammatory changes, suggesting acute pancreatitis. Mild fluid along the superior aspect of the pancreatic body, measuring 2.4 x 1.4 x 1.3 cm (coronal image 5), possibly reflecting an early/developing pseudocyst.  No drainable fluid collection/abscess.  Possible secondary wall thickening/inflammatory changes involving the proximal duodenum (series 2/image 23).  Gallbladder is  unremarkable.  No intrahepatic or extrahepatic ductal dilatation.  Kidneys are within normal limits.  No hydronephrosis.  No evidence of bowel obstruction.  Atherosclerotic calcifications of the abdominal aorta and branch vessels.  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  No abdominopelvic ascites.  No suspicious abdominopelvic lymphadenopathy.  Prostate is unremarkable.  Bladder is mildly thick-walled.  Moderate compression deformity at L1 (series 6/image 52), unchanged.  IMPRESSION: Acute pancreatitis.  Possible early/developing pseudocyst along the superior aspect of the pancreatic body.  No drainable fluid  collection/abscess.  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   Original Report Authenticated By: Charline Bills, M.D.   1. Pancreatitis    Pt reports he feels much better after IV fluids and pain medication.   Pt does not want to be admitted.   Pt reports he has had before and knows when he has to be admitted.   Pt agrees to return if symptoms worsen or change  MDM  Rx for zofran and percocet  Elson Areas, PA-C 06/08/13 2034

## 2013-06-08 NOTE — ED Notes (Signed)
Vomiting and abdominal pain since 0200.  Hx of Pancreatitis.

## 2013-06-08 NOTE — ED Provider Notes (Signed)
History    CSN: [id number] Arrival date & time 06/08/13  1503  None    Chief Complaint  Patient presents with  . Abdominal Pain  . Emesis   (Consider location/radiation/quality/duration/timing/severity/associated sxs/prior Treatment) HPI  Patient is a 47 yo M PMHx significant for pancreatitis, gastric peptic ulcer, AAA repair presenting to the ED for upper abdominal pain w/o radiation and associated nausea and non-bloody non-bilious vomiting since 2AM this morning. Patient states his pain "just hurts" and will not classify further. Rates his pain 9/10 w/o alleviating or aggravating factors. Patient states he had one beer on Saturday night, but denies any more recent ETOH use. Denies fevers, chills, CP, SOB.   Past Medical History  Diagnosis Date  . Pancreatitis   . AAA (abdominal aortic aneurysm)   . Gastric peptic ulcer   . Ulcer    Past Surgical History  Procedure Laterality Date  . Abdominal aortic aneurysm repair     No family history on file. History  Substance Use Topics  . Smoking status: Current Every Day Smoker -- 1.50 packs/day  . Smokeless tobacco: Not on file  . Alcohol Use: Yes     Comment: states no etoh since june 2012    Review of Systems  Constitutional: Positive for chills. Negative for fever.  Respiratory: Negative for shortness of breath.   Cardiovascular: Negative for chest pain.  Gastrointestinal: Positive for nausea, vomiting and abdominal pain. Negative for diarrhea, constipation, blood in stool and anal bleeding.  Neurological: Negative for headaches.  All other systems reviewed and are negative.    Allergies  Review of patient's allergies indicates no known allergies.  Home Medications   Current Outpatient Rx  Name  Route  Sig  Dispense  Refill  . albuterol (PROVENTIL,VENTOLIN) 90 MCG/ACT inhaler   Inhalation   Inhale 1 puff into the lungs every 6 (six) hours as needed. Shortness of breath and wheezing         . Multiple Vitamin  (MULITIVITAMIN WITH MINERALS) TABS   Oral   Take 1 tablet by mouth daily.         . ondansetron (ZOFRAN ODT) 4 MG disintegrating tablet   Oral   Take 1 tablet (4 mg total) by mouth every 8 (eight) hours as needed for nausea.   20 tablet   0   . oxyCODONE-acetaminophen (PERCOCET/ROXICET) 5-325 MG per tablet   Oral   Take 2 tablets by mouth every 4 (four) hours as needed for pain.   20 tablet   0   . thiamine 100 MG tablet   Oral   Take 100 mg by mouth daily.            BP 139/83  Pulse 68  Temp(Src) 98 F (36.7 C) (Oral)  Resp 16  Ht 5\' 8"  (1.727 m)  Wt 120 lb (54.432 kg)  BMI 18.25 kg/m2  SpO2 97% Physical Exam  Constitutional: He is oriented to person, place, and time. He appears well-developed and well-nourished. No distress.  HENT:  Head: Normocephalic and atraumatic.  Eyes: Conjunctivae are normal. No scleral icterus.  Neck: Neck supple.  Cardiovascular: Normal rate, regular rhythm, normal heart sounds and intact distal pulses.   Pulmonary/Chest: Effort normal and breath sounds normal. No respiratory distress.  Abdominal: Soft. Bowel sounds are normal. There is tenderness in the right upper quadrant, epigastric area and left upper quadrant. There is no rigidity, no rebound and no CVA tenderness.  Musculoskeletal: He exhibits no edema.  Neurological: He  is alert and oriented to person, place, and time.  Skin: Skin is warm and dry. He is not diaphoretic.    ED Course  Procedures (including critical care time)  Medications  sodium chloride 0.9 % bolus 1,000 mL (0 mLs Intravenous Stopped 06/08/13 1639)  HYDROmorphone (DILAUDID) injection 1 mg (1 mg Intravenous Given 06/08/13 1539)  ondansetron (ZOFRAN) injection 4 mg (4 mg Intravenous Given 06/08/13 1539)  HYDROmorphone (DILAUDID) injection 1 mg (1 mg Intravenous Given 06/08/13 1618)  ondansetron (ZOFRAN) injection 4 mg (4 mg Intravenous Given 06/08/13 1618)  iohexol (OMNIPAQUE) 300 MG/ML solution 50 mL (50 mLs  Oral Contrast Given 06/08/13 1632)  iohexol (OMNIPAQUE) 300 MG/ML solution 100 mL (100 mLs Intravenous Contrast Given 06/08/13 1759)  promethazine (PHENERGAN) injection 12.5 mg (12.5 mg Intravenous Given 06/08/13 1746)  HYDROmorphone (DILAUDID) injection 1 mg (1 mg Intravenous Given 06/08/13 1749)  HYDROmorphone (DILAUDID) injection 1 mg (1 mg Intravenous Given 06/08/13 1937)   Pain level reduced to 8/10 after first round of pain medication. Pain decreased to 7/10 after second round of medication.   Labs Reviewed  CBC WITH DIFFERENTIAL - Abnormal; Notable for the following:    WBC 13.4 (*)    Neutro Abs 10.4 (*)    Monocytes Absolute 1.1 (*)    All other components within normal limits  COMPREHENSIVE METABOLIC PANEL - Abnormal; Notable for the following:    Glucose, Bld 100 (*)    All other components within normal limits  LIPASE, BLOOD - Abnormal; Notable for the following:    Lipase 749 (*)    All other components within normal limits  URINALYSIS, ROUTINE W REFLEX MICROSCOPIC - Abnormal; Notable for the following:    Bilirubin Urine SMALL (*)    Ketones, ur >80 (*)    All other components within normal limits  CG4 I-STAT (LACTIC ACID)   Ct Abdomen Pelvis W Contrast  06/08/2013   *RADIOLOGY REPORT*  Clinical Data: Abdominal pain, vomiting.  History of AAA repair, pancreatitis, peptic ulcer.  CT ABDOMEN AND PELVIS WITH CONTRAST  Technique:  Multidetector CT imaging of the abdomen and pelvis was performed following the standard protocol during bolus administration of intravenous contrast.  Contrast: 100 ml Omnipaque-300 IV  Comparison: 02/12/2012  Findings: Lung bases are clear.  Liver, spleen, and adrenal glands are within normal limits.  Peripancreatic inflammatory changes, suggesting acute pancreatitis. Mild fluid along the superior aspect of the pancreatic body, measuring 2.4 x 1.4 x 1.3 cm (coronal image 5), possibly reflecting an early/developing pseudocyst.  No drainable fluid  collection/abscess.  Possible secondary wall thickening/inflammatory changes involving the proximal duodenum (series 2/image 23).  Gallbladder is unremarkable.  No intrahepatic or extrahepatic ductal dilatation.  Kidneys are within normal limits.  No hydronephrosis.  No evidence of bowel obstruction.  Atherosclerotic calcifications of the abdominal aorta and branch vessels.  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  No abdominopelvic ascites.  No suspicious abdominopelvic lymphadenopathy.  Prostate is unremarkable.  Bladder is mildly thick-walled.  Moderate compression deformity at L1 (series 6/image 52), unchanged.  IMPRESSION: Acute pancreatitis.  Possible early/developing pseudocyst along the superior aspect of the pancreatic body.  No drainable fluid collection/abscess.  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   Original Report Authenticated By: Charline Bills, M.D.   1. Pancreatitis     MDM  Pt w/ acute pancreatitis. CT scan pending at shift change. Patient's pain being managed in ED. Patient dispo pending CT scan results. Signed out  to [employer]. Patient d/w with Dr. Manus Gunning, agrees with plan.    Jeannetta Ellis, PA-C 06/09/13 1439

## 2013-06-08 NOTE — ED Notes (Signed)
Patient transported to CT 

## 2013-06-09 NOTE — ED Provider Notes (Signed)
Medical screening examination/treatment/procedure(s) were conducted as a shared visit with non-physician practitioner(s) and myself.  I personally evaluated the patient during the encounter  Upper abdominal pain similar to previous pancreatitis associated with vomiting. No peritoneal signs. Intact peripheral pulses.  Glynn Octave, MD 06/09/13 [id number]

## 2013-06-09 NOTE — ED Provider Notes (Signed)
Medical screening examination/treatment/procedure(s) were conducted as a shared visit with non-physician practitioner(s) and myself.  I personally evaluated the patient during the encounter   Glynn Octave, MD 06/09/13 0020

## 2014-03-07 ENCOUNTER — Emergency Department (HOSPITAL_COMMUNITY): Payer: 59

## 2014-03-07 ENCOUNTER — Emergency Department (HOSPITAL_COMMUNITY)
Admission: EM | Admit: 2014-03-07 | Discharge: 2014-03-07 | Disposition: A | Discharge status: Home / Self Care | Payer: 59 | Attending: Emergency Medicine | Admitting: Emergency Medicine

## 2014-03-07 ENCOUNTER — Encounter (HOSPITAL_COMMUNITY): Payer: Self-pay | Admitting: Emergency Medicine

## 2014-03-07 DIAGNOSIS — R072 Precordial pain: Secondary | ICD-10-CM | POA: Insufficient documentation

## 2014-03-07 DIAGNOSIS — R079 Chest pain, unspecified: Secondary | ICD-10-CM

## 2014-03-07 DIAGNOSIS — J4489 Other specified chronic obstructive pulmonary disease: Secondary | ICD-10-CM | POA: Insufficient documentation

## 2014-03-07 DIAGNOSIS — F17200 Nicotine dependence, unspecified, uncomplicated: Secondary | ICD-10-CM | POA: Insufficient documentation

## 2014-03-07 DIAGNOSIS — Z79899 Other long term (current) drug therapy: Secondary | ICD-10-CM | POA: Insufficient documentation

## 2014-03-07 DIAGNOSIS — M940 Chondrocostal junction syndrome [Tietze]: Secondary | ICD-10-CM | POA: Insufficient documentation

## 2014-03-07 DIAGNOSIS — Z8711 Personal history of peptic ulcer disease: Secondary | ICD-10-CM | POA: Insufficient documentation

## 2014-03-07 DIAGNOSIS — Z8679 Personal history of other diseases of the circulatory system: Secondary | ICD-10-CM | POA: Insufficient documentation

## 2014-03-07 DIAGNOSIS — J449 Chronic obstructive pulmonary disease, unspecified: Secondary | ICD-10-CM | POA: Insufficient documentation

## 2014-03-07 DIAGNOSIS — Z872 Personal history of diseases of the skin and subcutaneous tissue: Secondary | ICD-10-CM | POA: Insufficient documentation

## 2014-03-07 HISTORY — DX: Chronic obstructive pulmonary disease, unspecified: J44.9

## 2014-03-07 LAB — BASIC METABOLIC PANEL
BUN: 4 mg/dL — ABNORMAL LOW (ref 6–23)
CALCIUM: 8.6 mg/dL (ref 8.4–10.5)
CO2: 23 meq/L (ref 19–32)
CREATININE: 0.61 mg/dL (ref 0.50–1.35)
Chloride: 100 mEq/L (ref 96–112)
Glucose, Bld: 99 mg/dL (ref 70–99)
Potassium: 3.9 mEq/L (ref 3.7–5.3)
Sodium: 140 mEq/L (ref 137–147)

## 2014-03-07 LAB — CBC
HCT: 39.1 % (ref 39.0–52.0)
Hemoglobin: 13.7 g/dL (ref 13.0–17.0)
MCH: 33.7 pg (ref 26.0–34.0)
MCHC: 35 g/dL (ref 30.0–36.0)
MCV: 96.1 fL (ref 78.0–100.0)
PLATELETS: 343 10*3/uL (ref 150–400)
RBC: 4.07 MIL/uL — ABNORMAL LOW (ref 4.22–5.81)
RDW: 14.8 % (ref 11.5–15.5)
WBC: 12.4 10*3/uL — ABNORMAL HIGH (ref 4.0–10.5)

## 2014-03-07 LAB — I-STAT TROPONIN, ED: TROPONIN I, POC: 0.01 ng/mL (ref 0.00–0.08)

## 2014-03-07 MED ORDER — PREDNISONE 10 MG PO TABS: 20.0000 mg | ORAL_TABLET | Freq: Two times a day (BID) | ORAL | Status: DC

## 2014-03-07 NOTE — ED Notes (Signed)
MD at bedside. 

## 2014-03-07 NOTE — ED Provider Notes (Signed)
CSN: [id number]     Arrival date & time 03/07/14  2008 History   First MD Initiated Contact with Patient 03/07/14 2059     Chief Complaint  Patient presents with  . Chest Pain     (Consider location/radiation/quality/duration/timing/severity/associated sxs/prior Treatment) HPI Comments: Patient is a 48 year old male who presents with a two-month history of intermittent sharp pains in the center of his chest. He states it feels like "a bruise". Every few days this seems to recur. He denies any inciting injury. When the pain is there he typically rest and it seems to improve with time. He denies any exertional symptoms. He denies any fever, chills, or cough.  Patient is a 48 y.o. male presenting with chest pain. The history is provided by the patient.  Chest Pain Pain location:  Substernal area Pain quality: sharp   Pain radiates to:  Does not radiate Pain radiates to the back: no   Pain severity:  Moderate Timing:  Intermittent Progression:  Worsening Chronicity:  New Context: stress   Relieved by:  Nothing Worsened by:  Movement (Palpation) Ineffective treatments:  None tried Associated symptoms: no abdominal pain, no fatigue and no shortness of breath     Past Medical History  Diagnosis Date  . Pancreatitis   . AAA (abdominal aortic aneurysm)   . Gastric peptic ulcer   . Ulcer   . COPD (chronic obstructive pulmonary disease)    Past Surgical History  Procedure Laterality Date  . Abdominal aortic aneurysm repair     History reviewed. No pertinent family history. History  Substance Use Topics  . Smoking status: Current Every Day Smoker -- 1.50 packs/day  . Smokeless tobacco: Never Used  . Alcohol Use: Yes     Comment: states no etoh since june 2012    Review of Systems  Constitutional: Negative for fatigue.  Respiratory: Negative for shortness of breath.   Cardiovascular: Positive for chest pain.  Gastrointestinal: Negative for abdominal pain.  All other systems  reviewed and are negative.      Allergies  Review of patient's allergies indicates not on file.  Home Medications   Current Outpatient Rx  Name  Route  Sig  Dispense  Refill  . albuterol (PROVENTIL,VENTOLIN) 90 MCG/ACT inhaler   Inhalation   Inhale 1 puff into the lungs every 6 (six) hours as needed. Shortness of breath and wheezing         . Multiple Vitamin (MULITIVITAMIN WITH MINERALS) TABS   Oral   Take 1 tablet by mouth daily.         . ondansetron (ZOFRAN ODT) 4 MG disintegrating tablet   Oral   Take 1 tablet (4 mg total) by mouth every 8 (eight) hours as needed for nausea.   20 tablet   0   . oxyCODONE-acetaminophen (PERCOCET/ROXICET) 5-325 MG per tablet   Oral   Take 2 tablets by mouth every 4 (four) hours as needed for pain.   20 tablet   0   . thiamine 100 MG tablet   Oral   Take 100 mg by mouth daily.            BP 118/74  Pulse 73  Temp(Src) 97.7 F (36.5 C) (Oral)  Resp 11  Ht 5\' 7"  (1.702 m)  Wt 115 lb 12.8 oz (52.527 kg)  BMI 18.13 kg/m2  SpO2 99% Physical Exam  Nursing note and vitals reviewed. Constitutional: He is oriented to person, place, and time. He appears well-developed and well-nourished. No  distress.  HENT:  Head: Normocephalic and atraumatic.  Mouth/Throat: Oropharynx is clear and moist.  Neck: Normal range of motion. Neck supple.  Cardiovascular: Normal rate, regular rhythm and normal heart sounds.   No murmur heard. Pulmonary/Chest: Effort normal and breath sounds normal. No respiratory distress. He has no wheezes.  Abdominal: Soft. Bowel sounds are normal. He exhibits no distension. There is no tenderness.  Musculoskeletal: Normal range of motion. He exhibits no edema.  Lymphadenopathy:    He has no cervical adenopathy.  Neurological: He is alert and oriented to person, place, and time.  Skin: Skin is warm and dry. He is not diaphoretic.    ED Course  Procedures (including critical care time) Labs Review Labs  Reviewed  CBC - Abnormal; Notable for the following:    WBC 12.4 (*)    RBC 4.07 (*)    All other components within normal limits  BASIC METABOLIC PANEL - Abnormal; Notable for the following:    BUN 4 (*)    All other components within normal limits  I-STAT TROPOININ, ED   Imaging Review Dg Chest 2 View  03/07/2014   CLINICAL DATA:  Left-sided chest pain.  EXAM: CHEST  2 VIEW  COMPARISON:  12/06/2009  FINDINGS: Cardiac silhouette is normal in size and configuration. Normal mediastinal and hilar contours. Lungs are hyperexpanded, but clear. There is a moderate compression fracture of L1, stable. No pleural effusion or pneumothorax.  IMPRESSION: No acute cardiopulmonary disease.   Electronically Signed   By: Amie Portland M.D.   On: 03/07/2014 21:37     EKG Interpretation   Date/Time:  Sunday March 07 2014 20:25:43 EDT Ventricular Rate:  74 PR Interval:  152 QRS Duration: 88 QT Interval:  396 QTC Calculation: 439 R Axis:   91 Text Interpretation:  Normal sinus rhythm Right atrial enlargement  Rightward axis Pulmonary disease pattern Abnormal ECG Confirmed by DELOS   MD, Corayma Cashatt ([id number]) on 03/07/2014 9:54:59 PM      MDM   Final diagnoses:  None    Patient is a 48 year old male who presents with 71-month history of chest pain. This has occurred intermittently seems to come on every few days. Workup today reveals an EKG showing a sinus rhythm with no acute changes and negative troponin. His chest x-ray is clear and remainder of laboratory studies are all unremarkable. He is tender to palpation in the anterior chest wall. This reproduces his symptoms and this makes me believe that his symptoms are musculoskeletal in nature. I will recommend prednisone as an anti-inflammatory. He has an appointment this week with his cardiologist and assures me he will keep this. Also of note is that he did have a stress test approximately one year ago that was unremarkable.    Geoffery Lyons,  MD 03/07/14 2239

## 2014-03-07 NOTE — ED Notes (Signed)
Presents with one month of chest pain gradually worsening over the last few days begins in left chest and goes into left arm and left neck described as "it feels like abruise" asssociated with SOB and nausea. Nothing makes pain better, stress makes pain worse.. Pain is intermittent.  Pain rated 6/10 right at this time.

## 2014-03-07 NOTE — Discharge Instructions (Signed)
Prednisone as prescribed.  Followup with your cardiologist as scheduled this week. Return to emergency department if you develop worsening symptoms or other new and concerning symptoms.   Chest Pain (Nonspecific) It is often hard to give a specific diagnosis for the cause of chest pain. There is always a chance that your pain could be related to something serious, such as a heart attack or a blood clot in the lungs. You need to follow up with your caregiver for further evaluation. CAUSES   Heartburn.  Pneumonia or bronchitis.  Anxiety or stress.  Inflammation around your heart (pericarditis) or lung (pleuritis or pleurisy).  A blood clot in the lung.  A collapsed lung (pneumothorax). It can develop suddenly on its own (spontaneous pneumothorax) or from injury (trauma) to the chest.  Shingles infection (herpes zoster virus). The chest wall is composed of bones, muscles, and cartilage. Any of these can be the source of the pain.  The bones can be bruised by injury.  The muscles or cartilage can be strained by coughing or overwork.  The cartilage can be affected by inflammation and become sore (costochondritis). DIAGNOSIS  Lab tests or other studies, such as X-rays, electrocardiography, stress testing, or cardiac imaging, may be needed to find the cause of your pain.  TREATMENT   Treatment depends on what may be causing your chest pain. Treatment may include:  Acid blockers for heartburn.  Anti-inflammatory medicine.  Pain medicine for inflammatory conditions.  Antibiotics if an infection is present.  You may be advised to change lifestyle habits. This includes stopping smoking and avoiding alcohol, caffeine, and chocolate.  You may be advised to keep your head raised (elevated) when sleeping. This reduces the chance of acid going backward from your stomach into your esophagus.  Most of the time, nonspecific chest pain will improve within 2 to 3 days with rest and mild  pain medicine. HOME CARE INSTRUCTIONS   If antibiotics were prescribed, take your antibiotics as directed. Finish them even if you start to feel better.  For the next few days, avoid physical activities that bring on chest pain. Continue physical activities as directed.  Do not smoke.  Avoid drinking alcohol.  Only take over-the-counter or prescription medicine for pain, discomfort, or fever as directed by your caregiver.  Follow your caregiver's suggestions for further testing if your chest pain does not go away.  Keep any follow-up appointments you made. If you do not go to an appointment, you could develop lasting (chronic) problems with pain. If there is any problem keeping an appointment, you must call to reschedule. SEEK MEDICAL CARE IF:   You think you are having problems from the medicine you are taking. Read your medicine instructions carefully.  Your chest pain does not go away, even after treatment.  You develop a rash with blisters on your chest. SEEK IMMEDIATE MEDICAL CARE IF:   You have increased chest pain or pain that spreads to your arm, neck, jaw, back, or abdomen.  You develop shortness of breath, an increasing cough, or you are coughing up blood.  You have severe back or abdominal pain, feel nauseous, or vomit.  You develop severe weakness, fainting, or chills.  You have a fever. THIS IS AN EMERGENCY. Do not wait to see if the pain will go away. Get medical help at once. Call your local emergency services (911 in [place] Do not drive yourself to the hospital. MAKE SURE YOU:   Understand these instructions.  Will watch your condition.  Will get help right away if you are not doing well or get worse. Document Released: 09/05/2005 Document Revised: 02/18/2012 Document Reviewed: 07/01/2008 [facility] Patient Information 2014 [place], [place].  Costochondritis Costochondritis, sometimes called Tietze syndrome, is a swelling and irritation (inflammation) of the  tissue (cartilage) that connects your ribs with your breastbone (sternum). It causes pain in the chest and rib area. Costochondritis usually goes away on its own over time. It can take up to 6 weeks or longer to get better, especially if you are unable to limit your activities. CAUSES  Some cases of costochondritis have no known cause. Possible causes include:  Injury (trauma).  Exercise or activity such as lifting.  Severe coughing. SIGNS AND SYMPTOMS  Pain and tenderness in the chest and rib area.  Pain that gets worse when coughing or taking deep breaths.  Pain that gets worse with specific movements. DIAGNOSIS  Your health care provider will do a physical exam and ask about your symptoms. Chest X-rays or other tests may be done to rule out other problems. TREATMENT  Costochondritis usually goes away on its own over time. Your health care provider may prescribe medicine to help relieve pain. HOME CARE INSTRUCTIONS   Avoid exhausting physical activity. Try not to strain your ribs during normal activity. This would include any activities using chest, abdominal, and side muscles, especially if heavy weights are used.  Apply ice to the affected area for the first 2 days after the pain begins.  Put ice in a plastic bag.  Place a towel between your skin and the bag.  Leave the ice on for 20 minutes, 2 3 times a day.  Only take over-the-counter or prescription medicines as directed by your health care provider. SEEK MEDICAL CARE IF:  You have redness or swelling at the rib joints. These are signs of infection.  Your pain does not go away despite rest or medicine. SEEK IMMEDIATE MEDICAL CARE IF:   Your pain increases or you are very uncomfortable.  You have shortness of breath or difficulty breathing.  You cough up blood.  You have worse chest pains, sweating, or vomiting.  You have a fever or persistent symptoms for more than 2 3 days.  You have a fever and your symptoms  suddenly get worse. MAKE SURE YOU:   Understand these instructions.  Will watch your condition.  Will get help right away if you are not doing well or get worse. Document Released: 09/05/2005 Document Revised: 09/16/2013 Document Reviewed: 06/30/2013 [facility] LLCExitCare Patient Information 2014 [place]ExitCare, [place].

## 2014-11-03 ENCOUNTER — Other Ambulatory Visit: Payer: Self-pay | Admitting: Neurosurgery

## 2014-11-03 DIAGNOSIS — S32000S Wedge compression fracture of unspecified lumbar vertebra, sequela: Secondary | ICD-10-CM

## 2015-05-29 ENCOUNTER — Emergency Department (HOSPITAL_BASED_OUTPATIENT_CLINIC_OR_DEPARTMENT_OTHER)
Admission: EM | Admit: 2015-05-29 | Discharge: 2015-05-29 | Disposition: A | Discharge status: Home / Self Care | Payer: 59 | Attending: Emergency Medicine | Admitting: Emergency Medicine

## 2015-05-29 ENCOUNTER — Emergency Department (HOSPITAL_BASED_OUTPATIENT_CLINIC_OR_DEPARTMENT_OTHER): Payer: 59

## 2015-05-29 ENCOUNTER — Encounter (HOSPITAL_BASED_OUTPATIENT_CLINIC_OR_DEPARTMENT_OTHER): Payer: Self-pay | Admitting: Emergency Medicine

## 2015-05-29 DIAGNOSIS — Z8679 Personal history of other diseases of the circulatory system: Secondary | ICD-10-CM | POA: Diagnosis not present

## 2015-05-29 DIAGNOSIS — Z7952 Long term (current) use of systemic steroids: Secondary | ICD-10-CM | POA: Diagnosis not present

## 2015-05-29 DIAGNOSIS — R110 Nausea: Secondary | ICD-10-CM | POA: Insufficient documentation

## 2015-05-29 DIAGNOSIS — R5383 Other fatigue: Secondary | ICD-10-CM | POA: Insufficient documentation

## 2015-05-29 DIAGNOSIS — Z79899 Other long term (current) drug therapy: Secondary | ICD-10-CM | POA: Insufficient documentation

## 2015-05-29 DIAGNOSIS — J449 Chronic obstructive pulmonary disease, unspecified: Secondary | ICD-10-CM | POA: Diagnosis not present

## 2015-05-29 DIAGNOSIS — K862 Cyst of pancreas: Secondary | ICD-10-CM | POA: Diagnosis not present

## 2015-05-29 DIAGNOSIS — Z720 Tobacco use: Secondary | ICD-10-CM | POA: Insufficient documentation

## 2015-05-29 DIAGNOSIS — R1084 Generalized abdominal pain: Secondary | ICD-10-CM | POA: Diagnosis present

## 2015-05-29 DIAGNOSIS — R109 Unspecified abdominal pain: Secondary | ICD-10-CM

## 2015-05-29 DIAGNOSIS — K861 Other chronic pancreatitis: Secondary | ICD-10-CM | POA: Insufficient documentation

## 2015-05-29 HISTORY — DX: Endocarditis, valve unspecified: I38

## 2015-05-29 HISTORY — DX: Encounter for other specified aftercare: Z51.89

## 2015-05-29 LAB — COMPREHENSIVE METABOLIC PANEL
ALT: 7 U/L — ABNORMAL LOW (ref 17–63)
AST: 16 U/L (ref 15–41)
Albumin: 3.1 g/dL — ABNORMAL LOW (ref 3.5–5.0)
Alkaline Phosphatase: 86 U/L (ref 38–126)
Anion gap: 15 (ref 5–15)
BUN: 9 mg/dL (ref 6–20)
CO2: 20 mmol/L — ABNORMAL LOW (ref 22–32)
CREATININE: 0.59 mg/dL — AB (ref 0.61–1.24)
Calcium: 8.5 mg/dL — ABNORMAL LOW (ref 8.9–10.3)
Chloride: 95 mmol/L — ABNORMAL LOW (ref 101–111)
GFR calc non Af Amer: >60 mL/min (ref >60)
GLUCOSE: 105 mg/dL — AB (ref 65–99)
Potassium: 4.1 mmol/L (ref 3.5–5.1)
Sodium: 130 mmol/L — ABNORMAL LOW (ref 135–145)
Total Bilirubin: 0.4 mg/dL (ref 0.3–1.2)
Total Protein: 7.2 g/dL (ref 6.5–8.1)

## 2015-05-29 LAB — CBC WITH DIFFERENTIAL/PLATELET
Basophils Absolute: 0 10*3/uL (ref 0.0–0.1)
Basophils Relative: 0 % (ref 0–1)
EOS PCT: 0 % (ref 0–5)
Eosinophils Absolute: 0 10*3/uL (ref 0.0–0.7)
HCT: 39.4 % (ref 39.0–52.0)
HEMOGLOBIN: 13.1 g/dL (ref 13.0–17.0)
LYMPHS ABS: 2.4 10*3/uL (ref 0.7–4.0)
LYMPHS PCT: 17 % (ref 12–46)
MCH: 31.2 pg (ref 26.0–34.0)
MCHC: 33.2 g/dL (ref 30.0–36.0)
MCV: 93.8 fL (ref 78.0–100.0)
MONO ABS: 0.7 10*3/uL (ref 0.1–1.0)
MONOS PCT: 5 % (ref 3–12)
NEUTROS ABS: 11 10*3/uL — AB (ref 1.7–7.7)
NEUTROS PCT: 78 % — AB (ref 43–77)
Platelets: 695 10*3/uL — ABNORMAL HIGH (ref 150–400)
RBC: 4.2 MIL/uL — ABNORMAL LOW (ref 4.22–5.81)
RDW: 14.3 % (ref 11.5–15.5)
WBC: 14.1 10*3/uL — AB (ref 4.0–10.5)

## 2015-05-29 LAB — LIPASE, BLOOD: Lipase: 43 U/L (ref 22–51)

## 2015-05-29 MED ORDER — HYDROMORPHONE HCL 1 MG/ML IJ SOLN
1.0000 mg | Freq: Once | INTRAMUSCULAR | Status: AC
  Administered 2015-05-29: 1 mg via INTRAVENOUS
  Filled 2015-05-29: qty 1

## 2015-05-29 MED ORDER — OXYCODONE-ACETAMINOPHEN 5-325 MG PO TABS: 1.0000 | ORAL_TABLET | Freq: Three times a day (TID) | ORAL | Status: DC | PRN

## 2015-05-29 MED ORDER — IOHEXOL 300 MG/ML  SOLN
50.0000 mL | Freq: Once | INTRAMUSCULAR | Status: AC | PRN
  Administered 2015-05-29: 50 mL via ORAL

## 2015-05-29 MED ORDER — SODIUM CHLORIDE 0.9 % IV BOLUS (SEPSIS)
500.0000 mL | Freq: Once | INTRAVENOUS | Status: AC
  Administered 2015-05-29: 500 mL via INTRAVENOUS

## 2015-05-29 MED ORDER — MORPHINE SULFATE 4 MG/ML IJ SOLN: 4.0000 mg | Freq: Once | INTRAMUSCULAR | Status: DC

## 2015-05-29 MED ORDER — OXYCODONE-ACETAMINOPHEN 5-325 MG PO TABS
1.0000 | ORAL_TABLET | Freq: Once | ORAL | Status: AC
  Administered 2015-05-29: 1 via ORAL
  Filled 2015-05-29: qty 1

## 2015-05-29 MED ORDER — ONDANSETRON HCL 4 MG/2ML IJ SOLN
4.0000 mg | Freq: Once | INTRAMUSCULAR | Status: AC
  Administered 2015-05-29: 4 mg via INTRAVENOUS
  Filled 2015-05-29: qty 2

## 2015-05-29 MED ORDER — ONDANSETRON 4 MG PO TBDP: 4.0000 mg | ORAL_TABLET | Freq: Three times a day (TID) | ORAL | Status: DC | PRN

## 2015-05-29 MED ORDER — IOHEXOL 300 MG/ML  SOLN
80.0000 mL | Freq: Once | INTRAMUSCULAR | Status: AC | PRN
  Administered 2015-05-29: 80 mL via INTRAVENOUS

## 2015-05-29 NOTE — ED Notes (Signed)
Patient reports that his whole abdominal area is hurting and feels like his "chest is stopped up"

## 2015-05-29 NOTE — ED Notes (Signed)
Patient transported to CT 

## 2015-05-29 NOTE — ED Notes (Signed)
D/c home with family- Rx x 2 given for percocet and zofran- note for work given

## 2015-05-29 NOTE — ED Notes (Signed)
Pt c/o severe pain and stated "the shot they gave me didn't work". Joss Rn notified

## 2015-05-29 NOTE — ED Notes (Signed)
NP at bedside.
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
## 2015-07-27 ENCOUNTER — Inpatient Hospital Stay (HOSPITAL_BASED_OUTPATIENT_CLINIC_OR_DEPARTMENT_OTHER)
Admission: EM | Admit: 2015-07-27 | Discharge: 2015-08-02 | DRG: 812 | Disposition: A | Discharge status: Home / Self Care | Payer: 59 | Attending: Internal Medicine | Admitting: Internal Medicine

## 2015-07-27 ENCOUNTER — Encounter (HOSPITAL_BASED_OUTPATIENT_CLINIC_OR_DEPARTMENT_OTHER): Payer: Self-pay | Admitting: Emergency Medicine

## 2015-07-27 ENCOUNTER — Emergency Department (HOSPITAL_BASED_OUTPATIENT_CLINIC_OR_DEPARTMENT_OTHER): Payer: 59

## 2015-07-27 DIAGNOSIS — D500 Iron deficiency anemia secondary to blood loss (chronic): Secondary | ICD-10-CM | POA: Diagnosis not present

## 2015-07-27 DIAGNOSIS — R1011 Right upper quadrant pain: Secondary | ICD-10-CM

## 2015-07-27 DIAGNOSIS — R0602 Shortness of breath: Secondary | ICD-10-CM

## 2015-07-27 DIAGNOSIS — R1010 Upper abdominal pain, unspecified: Secondary | ICD-10-CM

## 2015-07-27 DIAGNOSIS — F17210 Nicotine dependence, cigarettes, uncomplicated: Secondary | ICD-10-CM | POA: Diagnosis present

## 2015-07-27 DIAGNOSIS — K922 Gastrointestinal hemorrhage, unspecified: Secondary | ICD-10-CM | POA: Diagnosis present

## 2015-07-27 DIAGNOSIS — R05 Cough: Secondary | ICD-10-CM

## 2015-07-27 DIAGNOSIS — K863 Pseudocyst of pancreas: Secondary | ICD-10-CM | POA: Diagnosis present

## 2015-07-27 DIAGNOSIS — K859 Acute pancreatitis without necrosis or infection, unspecified: Secondary | ICD-10-CM

## 2015-07-27 DIAGNOSIS — J449 Chronic obstructive pulmonary disease, unspecified: Secondary | ICD-10-CM | POA: Diagnosis present

## 2015-07-27 DIAGNOSIS — Z681 Body mass index (BMI) 19 or less, adult: Secondary | ICD-10-CM

## 2015-07-27 DIAGNOSIS — R059 Cough, unspecified: Secondary | ICD-10-CM

## 2015-07-27 DIAGNOSIS — Z8711 Personal history of peptic ulcer disease: Secondary | ICD-10-CM

## 2015-07-27 DIAGNOSIS — R112 Nausea with vomiting, unspecified: Secondary | ICD-10-CM | POA: Diagnosis present

## 2015-07-27 DIAGNOSIS — D72829 Elevated white blood cell count, unspecified: Secondary | ICD-10-CM | POA: Diagnosis not present

## 2015-07-27 DIAGNOSIS — R109 Unspecified abdominal pain: Secondary | ICD-10-CM | POA: Diagnosis present

## 2015-07-27 DIAGNOSIS — IMO0001 Reserved for inherently not codable concepts without codable children: Secondary | ICD-10-CM | POA: Diagnosis present

## 2015-07-27 DIAGNOSIS — D649 Anemia, unspecified: Secondary | ICD-10-CM | POA: Diagnosis present

## 2015-07-27 DIAGNOSIS — R627 Adult failure to thrive: Secondary | ICD-10-CM

## 2015-07-27 DIAGNOSIS — Z7952 Long term (current) use of systemic steroids: Secondary | ICD-10-CM

## 2015-07-27 DIAGNOSIS — E876 Hypokalemia: Secondary | ICD-10-CM | POA: Diagnosis not present

## 2015-07-27 DIAGNOSIS — F17200 Nicotine dependence, unspecified, uncomplicated: Secondary | ICD-10-CM | POA: Diagnosis present

## 2015-07-27 HISTORY — DX: Gastro-esophageal reflux disease without esophagitis: K21.9

## 2015-07-27 HISTORY — DX: Anemia, unspecified: D64.9

## 2015-07-27 HISTORY — DX: Personal history of other medical treatment: Z92.89

## 2015-07-27 LAB — CBC
HEMATOCRIT: 22.4 % — AB (ref 39.0–52.0)
Hemoglobin: 7 g/dL — ABNORMAL LOW (ref 13.0–17.0)
MCH: 30 pg (ref 26.0–34.0)
MCHC: 31.3 g/dL (ref 30.0–36.0)
MCV: 96.1 fL (ref 78.0–100.0)
Platelets: 852 10*3/uL — ABNORMAL HIGH (ref 150–400)
RBC: 2.33 MIL/uL — ABNORMAL LOW (ref 4.22–5.81)
RDW: 16.9 % — AB (ref 11.5–15.5)
WBC: 14.4 10*3/uL — ABNORMAL HIGH (ref 4.0–10.5)

## 2015-07-27 LAB — COMPREHENSIVE METABOLIC PANEL
ALBUMIN: 2.9 g/dL — AB (ref 3.5–5.0)
ALT: 8 U/L — ABNORMAL LOW (ref 17–63)
ANION GAP: 14 (ref 5–15)
AST: 19 U/L (ref 15–41)
Alkaline Phosphatase: 60 U/L (ref 38–126)
BILIRUBIN TOTAL: 0.1 mg/dL — AB (ref 0.3–1.2)
BUN: 7 mg/dL (ref 6–20)
CO2: 22 mmol/L (ref 22–32)
Calcium: 8.7 mg/dL — ABNORMAL LOW (ref 8.9–10.3)
Chloride: 100 mmol/L — ABNORMAL LOW (ref 101–111)
Creatinine, Ser: 0.63 mg/dL (ref 0.61–1.24)
GFR calc Af Amer: >60 mL/min (ref >60)
GLUCOSE: 128 mg/dL — AB (ref 65–99)
POTASSIUM: 3.9 mmol/L (ref 3.5–5.1)
Sodium: 136 mmol/L (ref 135–145)
TOTAL PROTEIN: 6.3 g/dL — AB (ref 6.5–8.1)

## 2015-07-27 LAB — ETHANOL: Alcohol, Ethyl (B): <5 mg/dL (ref <5)

## 2015-07-27 LAB — PROTIME-INR
INR: 1.07 (ref 0.00–1.49)
Prothrombin Time: 14.1 seconds (ref 11.6–15.2)

## 2015-07-27 LAB — OCCULT BLOOD X 1 CARD TO LAB, STOOL: Fecal Occult Bld: NEGATIVE

## 2015-07-27 LAB — LIPASE, BLOOD: Lipase: 13 U/L — ABNORMAL LOW (ref 22–51)

## 2015-07-27 MED ORDER — ONDANSETRON HCL 4 MG/2ML IJ SOLN
4.0000 mg | Freq: Once | INTRAMUSCULAR | Status: AC
  Administered 2015-07-27: 4 mg via INTRAVENOUS
  Filled 2015-07-27: qty 2

## 2015-07-27 MED ORDER — SODIUM CHLORIDE 0.9 % IV BOLUS (SEPSIS)
500.0000 mL | Freq: Once | INTRAVENOUS | Status: AC
  Administered 2015-07-27: 23:00:00 via INTRAVENOUS
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
## 2015-07-27 NOTE — ED Notes (Signed)
MD at bedside. 

## 2015-07-27 NOTE — ED Notes (Signed)
Patient transported to X-ray 

## 2015-07-28 ENCOUNTER — Encounter (HOSPITAL_COMMUNITY): Payer: Self-pay | Admitting: General Practice

## 2015-07-28 DIAGNOSIS — K863 Pseudocyst of pancreas: Secondary | ICD-10-CM | POA: Diagnosis present

## 2015-07-28 DIAGNOSIS — F17200 Nicotine dependence, unspecified, uncomplicated: Secondary | ICD-10-CM | POA: Diagnosis present

## 2015-07-28 DIAGNOSIS — J410 Simple chronic bronchitis: Secondary | ICD-10-CM | POA: Diagnosis not present

## 2015-07-28 DIAGNOSIS — R627 Adult failure to thrive: Secondary | ICD-10-CM

## 2015-07-28 DIAGNOSIS — Z7952 Long term (current) use of systemic steroids: Secondary | ICD-10-CM | POA: Diagnosis not present

## 2015-07-28 DIAGNOSIS — Z720 Tobacco use: Secondary | ICD-10-CM | POA: Diagnosis not present

## 2015-07-28 DIAGNOSIS — E876 Hypokalemia: Secondary | ICD-10-CM | POA: Diagnosis not present

## 2015-07-28 DIAGNOSIS — Z681 Body mass index (BMI) 19 or less, adult: Secondary | ICD-10-CM | POA: Diagnosis not present

## 2015-07-28 DIAGNOSIS — F17210 Nicotine dependence, cigarettes, uncomplicated: Secondary | ICD-10-CM | POA: Diagnosis present

## 2015-07-28 DIAGNOSIS — D649 Anemia, unspecified: Secondary | ICD-10-CM | POA: Diagnosis not present

## 2015-07-28 DIAGNOSIS — D500 Iron deficiency anemia secondary to blood loss (chronic): Secondary | ICD-10-CM | POA: Diagnosis present

## 2015-07-28 DIAGNOSIS — D72829 Elevated white blood cell count, unspecified: Secondary | ICD-10-CM | POA: Diagnosis not present

## 2015-07-28 DIAGNOSIS — R109 Unspecified abdominal pain: Secondary | ICD-10-CM | POA: Diagnosis present

## 2015-07-28 DIAGNOSIS — K922 Gastrointestinal hemorrhage, unspecified: Secondary | ICD-10-CM | POA: Diagnosis present

## 2015-07-28 DIAGNOSIS — Z8711 Personal history of peptic ulcer disease: Secondary | ICD-10-CM | POA: Diagnosis not present

## 2015-07-28 DIAGNOSIS — R1013 Epigastric pain: Secondary | ICD-10-CM | POA: Diagnosis not present

## 2015-07-28 DIAGNOSIS — J449 Chronic obstructive pulmonary disease, unspecified: Secondary | ICD-10-CM | POA: Diagnosis not present

## 2015-07-28 DIAGNOSIS — R1010 Upper abdominal pain, unspecified: Secondary | ICD-10-CM | POA: Diagnosis present

## 2015-07-28 LAB — BASIC METABOLIC PANEL
ANION GAP: 10 (ref 5–15)
BUN: 6 mg/dL (ref 6–20)
CALCIUM: 7.8 mg/dL — AB (ref 8.9–10.3)
CO2: 21 mmol/L — ABNORMAL LOW (ref 22–32)
Chloride: 105 mmol/L (ref 101–111)
Creatinine, Ser: 0.66 mg/dL (ref 0.61–1.24)
GLUCOSE: 82 mg/dL (ref 65–99)
POTASSIUM: 3.9 mmol/L (ref 3.5–5.1)
Sodium: 136 mmol/L (ref 135–145)

## 2015-07-28 LAB — URINALYSIS, ROUTINE W REFLEX MICROSCOPIC
Bilirubin Urine: NEGATIVE
Glucose, UA: NEGATIVE mg/dL
Hgb urine dipstick: NEGATIVE
Ketones, ur: NEGATIVE mg/dL
LEUKOCYTES UA: NEGATIVE
NITRITE: NEGATIVE
PROTEIN: NEGATIVE mg/dL
SPECIFIC GRAVITY, URINE: 1.021 (ref 1.005–1.030)
UROBILINOGEN UA: 0.2 mg/dL (ref 0.0–1.0)
pH: 7 (ref 5.0–8.0)

## 2015-07-28 LAB — IRON AND TIBC
IRON: 13 ug/dL — AB (ref 45–182)
SATURATION RATIOS: 4 % — AB (ref 17.9–39.5)
TIBC: 343 ug/dL (ref 250–450)
UIBC: 330 ug/dL

## 2015-07-28 LAB — HEMOGLOBIN AND HEMATOCRIT, BLOOD
HCT: 23.9 % — ABNORMAL LOW (ref 39.0–52.0)
HEMATOCRIT: 25.4 % — AB (ref 39.0–52.0)
HEMOGLOBIN: 8.5 g/dL — AB (ref 13.0–17.0)
Hemoglobin: 7.6 g/dL — ABNORMAL LOW (ref 13.0–17.0)

## 2015-07-28 LAB — RAPID URINE DRUG SCREEN, HOSP PERFORMED
AMPHETAMINES: NOT DETECTED
Barbiturates: POSITIVE — AB
Benzodiazepines: NOT DETECTED
COCAINE: NOT DETECTED
OPIATES: NOT DETECTED
TETRAHYDROCANNABINOL: NOT DETECTED

## 2015-07-28 LAB — CBC
HEMATOCRIT: 20.3 % — AB (ref 39.0–52.0)
HEMOGLOBIN: 6.4 g/dL — AB (ref 13.0–17.0)
MCH: 30 pg (ref 26.0–34.0)
MCHC: 31.5 g/dL (ref 30.0–36.0)
MCV: 95.3 fL (ref 78.0–100.0)
Platelets: 633 10*3/uL — ABNORMAL HIGH (ref 150–400)
RBC: 2.13 MIL/uL — AB (ref 4.22–5.81)
RDW: 17 % — ABNORMAL HIGH (ref 11.5–15.5)
WBC: 11.9 10*3/uL — ABNORMAL HIGH (ref 4.0–10.5)

## 2015-07-28 LAB — PREPARE RBC (CROSSMATCH)

## 2015-07-28 LAB — FERRITIN: FERRITIN: 5 ng/mL — AB (ref 24–336)

## 2015-07-28 LAB — RETICULOCYTES
RBC.: 2.13 MIL/uL — AB (ref 4.22–5.81)
RETIC COUNT ABSOLUTE: 85.2 10*3/uL (ref 19.0–186.0)
Retic Ct Pct: 4 % — ABNORMAL HIGH (ref 0.4–3.1)

## 2015-07-28 LAB — FOLATE: FOLATE: 19.7 ng/mL (ref >5.9)

## 2015-07-28 LAB — VITAMIN B12: Vitamin B-12: 796 pg/mL (ref 180–914)

## 2015-07-28 MED ORDER — PANTOPRAZOLE SODIUM 40 MG IV SOLR
40.0000 mg | Freq: Once | INTRAVENOUS | Status: AC
  Administered 2015-07-28: 40 mg via INTRAVENOUS
  Filled 2015-07-28: qty 40

## 2015-07-28 MED ORDER — PANTOPRAZOLE SODIUM 40 MG IV SOLR
40.0000 mg | Freq: Two times a day (BID) | INTRAVENOUS | Status: DC
  Administered 2015-07-28 – 2015-08-02 (×11): 40 mg via INTRAVENOUS
  Filled 2015-07-28 (×11): qty 40

## 2015-07-28 MED ORDER — SODIUM CHLORIDE 0.9 % IV SOLN: Freq: Once | INTRAVENOUS | Status: DC

## 2015-07-28 MED ORDER — PROMETHAZINE HCL 25 MG/ML IJ SOLN
12.5000 mg | Freq: Once | INTRAMUSCULAR | Status: AC
  Administered 2015-07-28: 12.5 mg via INTRAVENOUS
  Filled 2015-07-28: qty 1

## 2015-07-28 MED ORDER — SUCRALFATE 1 G PO TABS
1.0000 g | ORAL_TABLET | Freq: Three times a day (TID) | ORAL | Status: DC
  Administered 2015-07-28 – 2015-08-02 (×16): 1 g via ORAL
  Filled 2015-07-28 (×17): qty 1

## 2015-07-28 MED ORDER — OXYCODONE HCL 5 MG PO TABS: 5.0000 mg | ORAL_TABLET | ORAL | Status: DC | PRN

## 2015-07-28 MED ORDER — SODIUM CHLORIDE 0.9 % IJ SOLN
3.0000 mL | Freq: Two times a day (BID) | INTRAMUSCULAR | Status: DC
  Administered 2015-07-28 – 2015-08-02 (×6): 3 mL via INTRAVENOUS

## 2015-07-28 MED ORDER — ONDANSETRON HCL 4 MG PO TABS
4.0000 mg | ORAL_TABLET | Freq: Four times a day (QID) | ORAL | Status: DC | PRN
  Administered 2015-07-28: 4 mg via ORAL
  Filled 2015-07-28: qty 1

## 2015-07-28 MED ORDER — ACETAMINOPHEN 325 MG PO TABS
650.0000 mg | ORAL_TABLET | Freq: Once | ORAL | Status: AC
  Administered 2015-07-28: 650 mg via ORAL
  Filled 2015-07-28: qty 2

## 2015-07-28 MED ORDER — ACETAMINOPHEN 650 MG RE SUPP: 650.0000 mg | Freq: Four times a day (QID) | RECTAL | Status: DC | PRN

## 2015-07-28 MED ORDER — OXYCODONE HCL 5 MG PO TABS
10.0000 mg | ORAL_TABLET | Freq: Four times a day (QID) | ORAL | Status: DC | PRN
  Administered 2015-07-28 – 2015-08-02 (×15): 10 mg via ORAL
  Filled 2015-07-28 (×15): qty 2

## 2015-07-28 MED ORDER — CETYLPYRIDINIUM CHLORIDE 0.05 % MT LIQD
7.0000 mL | Freq: Two times a day (BID) | OROMUCOSAL | Status: DC
  Administered 2015-07-28 – 2015-07-31 (×5): 7 mL via OROMUCOSAL

## 2015-07-28 MED ORDER — ALUM & MAG HYDROXIDE-SIMETH 200-200-20 MG/5ML PO SUSP: 30.0000 mL | Freq: Four times a day (QID) | ORAL | Status: DC | PRN

## 2015-07-28 MED ORDER — SODIUM CHLORIDE 0.9 % IV SOLN
INTRAVENOUS | Status: DC
  Administered 2015-07-28 – 2015-07-30 (×2): via INTRAVENOUS

## 2015-07-28 MED ORDER — HYDROMORPHONE HCL 1 MG/ML IJ SOLN
0.5000 mg | INTRAMUSCULAR | Status: DC | PRN
  Administered 2015-07-28 – 2015-07-29 (×5): 1 mg via INTRAVENOUS
  Administered 2015-07-29 (×3): 0.5 mg via INTRAVENOUS
  Administered 2015-07-29 – 2015-08-02 (×19): 1 mg via INTRAVENOUS
  Filled 2015-07-28 (×29): qty 1

## 2015-07-28 MED ORDER — ACETAMINOPHEN 325 MG PO TABS: 650.0000 mg | ORAL_TABLET | Freq: Four times a day (QID) | ORAL | Status: DC | PRN

## 2015-07-28 MED ORDER — SUCRALFATE 1 G PO TABS
1.0000 g | ORAL_TABLET | Freq: Once | ORAL | Status: AC
  Administered 2015-07-28: 1 g via ORAL
  Filled 2015-07-28: qty 1

## 2015-07-28 MED ORDER — BOOST / RESOURCE BREEZE PO LIQD
1.0000 | Freq: Three times a day (TID) | ORAL | Status: DC
  Administered 2015-07-28 – 2015-07-31 (×6): 1 via ORAL

## 2015-07-28 MED ORDER — NICOTINE 21 MG/24HR TD PT24
21.0000 mg | MEDICATED_PATCH | Freq: Every day | TRANSDERMAL | Status: DC
  Administered 2015-07-28 – 2015-08-02 (×6): 21 mg via TRANSDERMAL
  Filled 2015-07-28 (×6): qty 1

## 2015-07-28 MED ORDER — SODIUM CHLORIDE 0.9 % IV SOLN
Freq: Once | INTRAVENOUS | Status: AC
  Administered 2015-07-28: 17:00:00 via INTRAVENOUS

## 2015-07-28 MED ORDER — ONDANSETRON HCL 4 MG/2ML IJ SOLN
4.0000 mg | Freq: Four times a day (QID) | INTRAMUSCULAR | Status: DC | PRN
  Administered 2015-07-29 – 2015-07-31 (×5): 4 mg via INTRAVENOUS
  Filled 2015-07-28 (×5): qty 2

## 2015-07-28 NOTE — Progress Notes (Signed)
Called by Dr.Horton at [facility] PCP: Rickey Martin, unassigned ? 48/M with chronic abd pain/pancreatitis, in Ed with same, abd pain, dark stools, decreased appetite Hb now 7, down from 13, hemoccult negative in ED Labs at baseline, exam benign: needs workup for anemia Accepted to [facility] admits inpatient  Zannie Cove, MD

## 2015-07-28 NOTE — H&P (Signed)
[facility] Admission History and Physical       Rickey Martin ZOX:[id number] DOB: [birth year] DOA: 07/27/2015  Referring physician:  PCP: Tommie Raymond, MD  Specialists:   Chief Complaint: Upper ABD Pain  HPI: Rickey Martin is a 49 y.o. male with a history of Pancreatitis and COPD who presented to the [facility] ED with complaints of Epigastric ABD pain x 1 week with Nausea.   He also reports that he had passed dark stools.   He denies any fever or chills.   He was found to have a Hemoglobin level of 7.0 and his previous hemoglobin  level had been 13 in    .  An FOBT was performed and was Heme Negative, and an Acute ABD series was also performed and was negative for acute findings and negative for free air.     Review of Systems:   Constitutional:  +Weight Loss, Night Sweats, Fevers, Chills, Dizziness, Light Headedness, Fatigue, or Generalized Weakness HEENT: No Headaches, Difficulty Swallowing,Tooth/Dental Problems,Sore Throat,  No Sneezing, Rhinitis, Ear Ache, Nasal Congestion, or Post Nasal Drip,  Cardio-vascular:  No Chest pain, Orthopnea, PND, Edema in Lower Extremities, Anasarca, Dizziness, Palpitations  Resp: No Dyspnea, No DOE, No Productive Cough, No Non-Productive Cough, No Hemoptysis, No Wheezing.    GI: No Heartburn, Indigestion, +Abdominal Pain, Nausea, Vomiting, Diarrhea, Constipation, Hematemesis, Hematochezia, Melena, Change in Bowel Habits,  Loss of Appetite  GU: No Dysuria, No Change in Color of Urine, No Urgency or Urinary Frequency, No Flank pain.  Musculoskeletal: No Joint Pain or Swelling, No Decreased Range of Motion, No Back Pain.  Neurologic: No Syncope, No Seizures, Muscle Weakness, Paresthesia, Vision Disturbance or Loss, No Diplopia, No Vertigo, No Difficulty Walking,  Skin: No Rash or Lesions. Psych: No Change in Mood or Affect, No Depression or Anxiety, No Memory loss, No Confusion, or Hallucinations   Past Medical History  Diagnosis Date  .  Pancreatitis   . AAA (abdominal aortic aneurysm)   . Gastric peptic ulcer   . Ulcer   . COPD (chronic obstructive pulmonary disease)   . Blood transfusion without reported diagnosis   . Heart valve disorder "leaky heart valve"     Past Surgical History  Procedure Laterality Date  . Abdominal aortic aneurysm repair    . Abdominal aortic aneurysm repair  stent      Prior to Admission medications   Medication Sig Start Date End Date Taking? Authorizing Provider  acetaminophen (TYLENOL) 500 MG tablet Take 500 mg by mouth 2 (two) times daily as needed (pain).    Historical Provider, MD  albuterol (PROVENTIL,VENTOLIN) 90 MCG/ACT inhaler Inhale 2 puffs into the lungs 3 (three) times daily as needed for wheezing or shortness of breath.     Historical Provider, MD  levothyroxine (SYNTHROID, LEVOTHROID) 50 MCG tablet Take 50 mcg by mouth daily before breakfast.    Historical Provider, MD  omeprazole (PRILOSEC) 20 MG capsule Take 20 mg by mouth daily.    Historical Provider, MD  ondansetron (ZOFRAN ODT) 4 MG disintegrating tablet Take 1 tablet (4 mg total) by mouth every 8 (eight) hours as needed for nausea or vomiting. 05/29/15   Teressa Lower, NP  oxyCODONE-acetaminophen (PERCOCET/ROXICET) 5-325 MG per tablet Take 1-2 tablets by mouth every 8 (eight) hours as needed for severe pain. 05/29/15   Teressa Lower, NP  predniSONE (DELTASONE) 10 MG tablet Take 2 tablets (20 mg total) by mouth 2 (two) times daily. 03/07/14   Geoffery Lyons, MD  promethazine (  PHENERGAN) 25 MG tablet Take 1 tablet (25 mg total) by mouth every 6 (six) hours as needed for nausea. 07/23/11 07/30/11  Elson Areas, PA-C  promethazine (PHENERGAN) 25 MG tablet Take 25 mg by mouth 2 (two) times daily as needed for nausea or vomiting.    Historical Provider, MD  thiamine 100 MG tablet Take 100 mg by mouth daily. Vitamin B1    Historical Provider, MD     No Known Allergies  Social History:  reports that he has been smoking.  He  has never used smokeless tobacco. He reports that he drinks alcohol. He reports that he does not use illicit drugs.    History reviewed. No pertinent family history.     Physical Exam:  GEN:  Pleasant Cachectic48 y.o. Caucasian male examined and in no acute distress; cooperative with exam Filed Vitals:   07/28/15 0039 07/28/15 0125 07/28/15 0210 07/28/15 0248  BP: 104/59 117/61  125/55  Pulse: 68 66 94 71  Temp:  97.6 F (36.4 C)  97.7 F (36.5 C)  TempSrc:  Oral  Oral  Resp: Height:     (1.727 m)  Weight:    43.636 kg (96 lb 3.2 oz)  SpO2: 96% 99% 96% 98%   Blood pressure 125/55, pulse 71, temperature 97.7 F (36.5 C), temperature source Oral, resp. rate 18, height  (1.727 m), weight 43.636 kg (96 lb 3.2 oz), SpO2 98 %. PSYCH: He is alert and oriented x4; does not appear anxious does not appear depressed; affect is normal HEENT: Normocephalic and Atraumatic, Mucous membranes pink; PERRLA; EOM intact; Fundi:  Benign;  No scleral icterus, Nares: Patent, Oropharynx: Clear, Fair Dentition,    Neck:  FROM, No Cervical Lymphadenopathy nor Thyromegaly or Carotid Bruit; No JVD; Breasts:: Not examined CHEST WALL: No tenderness CHEST: Normal respiration, clear to auscultation bilaterally HEART: Regular rate and rhythm; no murmurs rubs or gallops BACK: No kyphosis or scoliosis; No CVA tenderness ABDOMEN: Positive Bowel Sounds, Scaphoid, Soft Non-Tender, No Rebound or Guarding; No Masses, No Organomegaly Rectal Exam: Not done EXTREMITIES: No Cyanosis, Clubbing, or Edema; No Ulcerations. Genitalia: not examined PULSES: 2+ and symmetric SKIN: Normal hydration no rash or ulceration CNS:  Alert and Oriented x 4, No Focal Deficits Vascular: pulses palpable throughout    Labs on Admission:  Basic Metabolic Panel:  Recent Labs Lab 07/27/15 2310  NA 136  K 3.9  CL 100*  CO2 22  GLUCOSE 128*  BUN 7  CREATININE 0.63  CALCIUM 8.7*   Liver Function  Tests:  Recent Labs Lab 07/27/15 2310  AST 19  ALT 8*  ALKPHOS 60  BILITOT 0.1*  PROT 6.3*  ALBUMIN 2.9*    Recent Labs Lab 07/27/15 2310  LIPASE 13*   No results for input(s): AMMONIA in the last 168 hours. CBC:  Recent Labs Lab 07/27/15 2310  WBC 14.4*  HGB 7.0*  HCT 22.4*  MCV 96.1  PLT 852*   Cardiac Enzymes: No results for input(s): CKTOTAL, CKMB, CKMBINDEX, TROPONINI in the last 168 hours.  BNP (last 3 results) No results for input(s): BNP in the last 8760 hours.  ProBNP (last 3 results) No results for input(s): PROBNP in the last 8760 hours.  CBG: No results for input(s): GLUCAP in the last 168 hours.  Radiological Exams on Admission: Dg Abd Acute W/chest  07/28/2015   CLINICAL DATA:  Acute onset of upper abdominal pain for 1 week. Initial encounter.  EXAM: DG  ABDOMEN ACUTE W/ 1V CHEST  COMPARISON:  Chest radiograph performed 03/07/2014, and CT of the abdomen and pelvis from 05/19/2015  FINDINGS: The lungs are well-aerated. Mild chronic peribronchial thickening is noted. There is no evidence of focal opacification, pleural effusion or pneumothorax. The cardiomediastinal silhouette is within normal limits.  The visualized bowel gas pattern is unremarkable. Scattered fluid and air are seen within the colon; there is no evidence of small bowel dilatation to suggest obstruction. No free intra-abdominal air is identified on the provided upright view. Coiling is noted at the left upper quadrant.  No acute osseous abnormalities are seen; the sacroiliac joints are unremarkable in appearance.  IMPRESSION: 1. Scattered fluid and air within the colon, without evidence for obstruction. No free intra-abdominal air seen. 2. Mild chronic peribronchial thickening noted. Lungs otherwise clear.   Electronically Signed   By: Roanna Raider M.D.   On: 07/28/2015 00:19     EKG: Independently reviewed. Normal Sinus Rhythm rate = 69    Assessment/Plan:      49 y.o. male with   Active Problems:   1.    Anemia-    Telemetry Monitoring   Monitor H/Hs   Send FOBT daily x 3   Transfuse 1 unit PRBCs   IV Protonix   Consult GI     2.    ABD Pain- due to Gastritis or PUD   IV Protonix   PRN IV Dilaudid     3.   Failure to thrive in adult   Nutrition Evaluation     4.   COPD (chronic obstructive pulmonary disease)   Duonebs PRN   Monitor O2 sats     5.   Tobacco use disorder   Nicotine Patch daily     6.    DVT Prophylaxis   SCDs    Code Status:     FULL CODE    Family Communication:    No Family Present    Disposition Plan:    Inpatient Status        Time spent: 33 Minutes      Ron Parker [facility] Pager [phone]   If 7AM -7PM Please Contact the Day Rounding Team MD for [facility]  If 7PM-7AM, Please Contact Night-Floor Coverage  [email or web address] Password [email or web address] 07/28/2015, 4:24 AM     ADDENDUM:   Patient was seen and examined on 07/28/2015

## 2015-07-28 NOTE — Progress Notes (Signed)
[facility] PROGRESS NOTE  LEGRAND LASSER NWG:[id number] DOB: 21-Dec-1965 DOA: 07/27/2015 PCP: Tommie Raymond, MD  Assessment/Plan: 49 y/o male with PMH of COPD, Ongoing tobacco use, Anemia, pancreatitis, h/o PUD presented with epigastric abdominal pains, nausea, weight loss. Found to have severe symptomatic anemia.   1. Abdominal pains.  H/o PUD.  -we will cont PPI, sucralfate. Check H Pylori. consulted GI to evaluate for endoscopy  2. Symptomatic anemia. Severe IDA. ? Blood loss. No s/s of active bleeding.  -Tfsed 1 unit. We will transfuse another unit today. Monitor closely  3. COPD (chronic obstructive pulmonary disease). No wheezing on exam. Cont Duonebs PRN 4. Tobacco use disorder. Counseled to stop smoking. Nicotine Patch daily    Code Status: full Family Communication: d/w patient, RN (indicate person spoken with, relationship, and if by phone, the number) Disposition Plan: home 1-2 days    Consultants:  GI  Procedures:  none  Antibiotics:  none (indicate start date, and stop date if known)  HPI/Subjective: Alert   Objective: Filed Vitals:   07/28/15 1113  BP: 99/50  Pulse: 61  Temp: 97.9 F (36.6 C)  Resp: 20    Intake/Output Summary (Last 24 hours) at 07/28/15 1431 Last data filed at 07/28/15 1113  Gross per 24 hour  Intake   1670 ml  Output      0 ml  Net   1670 ml   Filed Weights   07/27/15 2226 07/28/15 0248  Weight: 45.36 kg (100 lb) 43.636 kg (96 lb 3.2 oz)    Exam:   General:  No distress   Cardiovascular: s1,s2 rrr  Respiratory: CTA BL  Abdomen: soft, nt,nd   Musculoskeletal: no leg edema   Data Reviewed: Basic Metabolic Panel:  Recent Labs Lab 07/27/15 2310 07/28/15 0457  NA 136 136  K 3.9 3.9  CL 100* 105  CO2 22 21*  GLUCOSE 128* 82  BUN 7 6  CREATININE 0.63 0.66  CALCIUM 8.7* 7.8*   Liver Function Tests:  Recent Labs Lab 07/27/15 2310  AST 19  ALT 8*  ALKPHOS 60  BILITOT 0.1*  PROT 6.3*   ALBUMIN 2.9*    Recent Labs Lab 07/27/15 2310  LIPASE 13*   No results for input(s): AMMONIA in the last 168 hours. CBC:  Recent Labs Lab 07/27/15 2310 07/28/15 0457 07/28/15 1200  WBC 14.4* 11.9*  --   HGB 7.0* 6.4* 7.6*  HCT 22.4* 20.3* 23.9*  MCV 96.1 95.3  --   PLT 852* 633*  --    Cardiac Enzymes: No results for input(s): CKTOTAL, CKMB, CKMBINDEX, TROPONINI in the last 168 hours. BNP (last 3 results) No results for input(s): BNP in the last 8760 hours.  ProBNP (last 3 results) No results for input(s): PROBNP in the last 8760 hours.  CBG: No results for input(s): GLUCAP in the last 168 hours.  No results found for this or any previous visit (from the past 240 hour(s)).   Studies: Dg Abd Acute W/chest  07/28/2015   CLINICAL DATA:  Acute onset of upper abdominal pain for 1 week. Initial encounter.  EXAM: DG ABDOMEN ACUTE W/ 1V CHEST  COMPARISON:  Chest radiograph performed 03/07/2014, and CT of the abdomen and pelvis from 05/19/2015  FINDINGS: The lungs are well-aerated. Mild chronic peribronchial thickening is noted. There is no evidence of focal opacification, pleural effusion or pneumothorax. The cardiomediastinal silhouette is within normal limits.  The visualized bowel gas pattern is unremarkable. Scattered fluid and air are seen  within the colon; there is no evidence of small bowel dilatation to suggest obstruction. No free intra-abdominal air is identified on the provided upright view. Coiling is noted at the left upper quadrant.  No acute osseous abnormalities are seen; the sacroiliac joints are unremarkable in appearance.  IMPRESSION: 1. Scattered fluid and air within the colon, without evidence for obstruction. No free intra-abdominal air seen. 2. Mild chronic peribronchial thickening noted. Lungs otherwise clear.   Electronically Signed   By: Roanna Raider M.D.   On: 07/28/2015 00:19    Scheduled Meds: . sodium chloride   Intravenous Once  . antiseptic oral  rinse  7 mL Mouth Rinse BID  . feeding supplement  1 Container Oral TID BM  . nicotine  21 mg Transdermal Daily  . pantoprazole (PROTONIX) IV  40 mg Intravenous Q12H  . sodium chloride  3 mL Intravenous Q12H   Continuous Infusions: . sodium chloride 100 mL/hr at 07/28/15 0446    Active Problems:   Anemia   Failure to thrive in adult   COPD (chronic obstructive pulmonary disease)   Tobacco use disorder   Abdominal pain   Absolute anemia    Time spent: >35 minutes     Esperanza Sheets  [facility] Pager [phone]. If 7PM-7AM, please contact night-coverage at [email or web address], password [facility] 07/28/2015, 2:31 PM  LOS: 0 days

## 2015-07-28 NOTE — Progress Notes (Signed)
CRITICAL VALUE ALERT  Critical value received:  hgb-6.4  Date of notification:  07/28/15  Time of notification:  0544  Critical value read back:Yes.    Nurse who received alert:  De Nurse  MD notified (1st page):  Dr. Lovell Sheehan  Time of first page:  0555  Responding MD:  Dr.Jenkins  Time MD responded:  0557  Laverda Sorenson, RN

## 2015-07-28 NOTE — Progress Notes (Signed)
Utilization review completed. Nyriah Coote, RN, BSN. 

## 2015-07-28 NOTE — Consult Note (Signed)
Referring Provider: Dr. York Spaniel Primary Care Physician:  Tommie Raymond, MD Primary Gastroenterologist:  Gentry Fitz  Reason for Consultation:  GI bleed; Abdominal pain  HPI: Rickey Martin is a 49 y.o. male with a history of peptic ulcer disease and pancreatitis being seen due to RUQ and epigastric pain for the past week that is severe and stabbing and feels like his pain from his pancreatitis. Reports multiple episodes of N/V throughout the day for the past 1.5 weeks. Has lost 20 pounds in the past 6 months. Reports having 3-4 red bloody stools prior to admit that lasted a day. Denies melena. Denies hematemesis. 20 pound unintentional weight loss in the past 6 months. Has been taking multiple Goody powder's per day for a week due to his abdominal pain. History of a peptic ulcer bleed in 2011 and had a clean-based ulcer in the stomach at that time. Heme negative. Hgb 7 (13 in June). Abd xray negative. 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 Reports cutting down on heavy alcohol use several years ago and reports drinking occasionally now. Has never had a colonoscopy. Wife at bedside.   Past Medical History  Diagnosis Date  . Pancreatitis   . AAA (abdominal aortic aneurysm)   . Gastric peptic ulcer   . Ulcer   . COPD (chronic obstructive pulmonary disease)   . Blood transfusion without reported diagnosis   . Heart valve disorder "leaky heart valve"    Past Surgical History  Procedure Laterality Date  . Abdominal aortic aneurysm repair    . Abdominal aortic aneurysm repair  stent    Prior to Admission medications   Not on File    Scheduled Meds: . sodium chloride   Intravenous Once  . antiseptic oral rinse  7 mL Mouth Rinse BID  . feeding supplement  1 Container Oral TID BM  . nicotine  21 mg Transdermal Daily  . pantoprazole (PROTONIX) IV  40 mg Intravenous Q12H  . sodium chloride  3 mL Intravenous Q12H  . sucralfate  1 g Oral TID WC & HS    Continuous Infusions: . sodium chloride 50 mL/hr (07/28/15 1449)   PRN Meds:.acetaminophen **OR** acetaminophen, alum & mag hydroxide-simeth, HYDROmorphone (DILAUDID) injection, ondansetron **OR** ondansetron (ZOFRAN) IV, oxyCODONE  Allergies as of 07/27/2015  . (No Known Allergies)    History reviewed. No pertinent family history.  Social History   Social History  . Marital Status: Single    Spouse Name: N/A  . Number of Children: N/A  . Years of Education: N/A   Occupational History  . Not on file.   Social History Main Topics  . Smoking status: Current Every Day Smoker -- 1.50 packs/day  . Smokeless tobacco: Never Used  . Alcohol Use: Yes     Comment: admits to drinking beer last week  . Drug Use: No  . Sexual Activity: Not on file   Other Topics Concern  . Not on file   Social History Narrative    Review of Systems: All negative except as stated above in HPI.  Physical Exam: Vital signs: Filed Vitals:   07/28/15 1655  BP: 88/52  Pulse: 63  Temp: 97.8 F (36.6 C)  Resp: 16   Last BM Date: 07/27/15 General:   Alert,  Thin, disheveled, no acute distress, tattoos noted on arms Head: atraumatic Eyes: pupils equal and reactive, anicteric sclera ENT: oropharynx clear Neck: supple, nontender Lungs:  Clear throughout to auscultation.   No wheezes, crackles,  or rhonchi. No acute distress. Heart:  Regular rate and rhythm; no murmurs, clicks, rubs,  or gallops. Abdomen: RUQ and epigastric tenderness with guarding, flat, nondistended, +BS Rectal:  Deferred Ext: pedal pulses intact, no edema  GI:  Lab Results:  Recent Labs  07/27/15 2310 07/28/15 0457 07/28/15 1200  WBC 14.4* 11.9*  --   HGB 7.0* 6.4* 7.6*  HCT 22.4* 20.3* 23.9*  PLT 852* 633*  --    BMET  Recent Labs  07/27/15 2310 07/28/15 0457  NA 136 136  K 3.9 3.9  CL 100* 105  CO2 22 21*  GLUCOSE 128* 82  BUN 7 6  CREATININE 0.63 0.66  CALCIUM 8.7* 7.8*   LFT  Recent Labs   07/27/15 2310  PROT 6.3*  ALBUMIN 2.9*  AST 19  ALT 8*  ALKPHOS 60  BILITOT 0.1*   PT/INR  Recent Labs  07/27/15 2310  LABPROT 14.1  INR 1.07     Studies/Results: Dg Abd Acute W/chest  07/28/2015   CLINICAL DATA:  Acute onset of upper abdominal pain for 1 week. Initial encounter.  EXAM: DG ABDOMEN ACUTE W/ 1V CHEST  COMPARISON:  Chest radiograph performed 03/07/2014, and CT of the abdomen and pelvis from 05/19/2015  FINDINGS: The lungs are well-aerated. Mild chronic peribronchial thickening is noted. There is no evidence of focal opacification, pleural effusion or pneumothorax. The cardiomediastinal silhouette is within normal limits.  The visualized bowel gas pattern is unremarkable. Scattered fluid and air are seen within the colon; there is no evidence of small bowel dilatation to suggest obstruction. No free intra-abdominal air is identified on the provided upright view. Coiling is noted at the left upper quadrant.  No acute osseous abnormalities are seen; the sacroiliac joints are unremarkable in appearance.  IMPRESSION: 1. Scattered fluid and air within the colon, without evidence for obstruction. No free intra-abdominal air seen. 2. Mild chronic peribronchial thickening noted. Lungs otherwise clear.   Electronically Signed   By: Roanna Raider M.D.   On: 07/28/2015 00:19    Impression/Plan: 49 yo with history of pancreatitis and peptic ulcer disease presents with severe RUQ and epigastric abdominal pain with nausea and vomiting. Episodes of hematochezia prior to admit without melena. He also has been using a lot of Goody Powders recently. DDx peptic ulcer disease vs pancreatic pseudocyst with mass effect on stomach. Agree with blood transfusion. Clear liquids. NPO after midnight. EGD tomorrow to assess for PUD and if unrevealing then he may need an updated CT to look for worsening pseudocysts. He likely has chronic pancreatitis as well hence the normal Lipase. If CT and EGD  unrevealing, then may need an inpatient colonoscopy otherwise if he stabilizes then can do colonoscopy as he reportedly has previously been scheduled to have it next week.    LOS: 0 days   Kaylise Blakeley C.  07/28/2015, 5:33 PM  Pager [phone]  If no answer or after 5 PM call [phone]

## 2015-07-28 NOTE — ED Notes (Signed)
Report received from Andrea RN.

## 2015-07-28 NOTE — ED Notes (Signed)
Report given to [facility] Debra EMT and Asher Muir RN

## 2015-07-28 NOTE — Progress Notes (Signed)
New Admission Note:   Arrival Method:  Via [facility]link from [facility]  Mental Orientation: A&Ox4 Telemetry: NSR Assessment: Completed Skin: dry flaky skin on hills IV: NSL Pain: ABD PAIN 8/10 Tubes: N/A Safety Measures: Safety Fall Prevention Plan has been given, discussed and signed Admission: Completed [facility] [address] Orientation: Patient has been orientated to the room, unit and staff. Family:  None present  Orders have been reviewed and implemented. Will continue to monitor the patient. Call light has been placed within reach and bed alarm has been activated.   De Nurse BSN, Publishing copy number: [phone]

## 2015-07-28 NOTE — Progress Notes (Signed)
Initial Nutrition Assessment  DOCUMENTATION CODES:   Severe malnutrition in context of chronic illness  INTERVENTION: Boost Breeze po TID Provide Ensure Enlive upon diet advancement   NUTRITION DIAGNOSIS:   Malnutrition related to nausea, chronic illness, poor appetite, altered GI function as evidenced by severe depletion of body fat, severe depletion of muscle mass.    GOAL:   Patient will meet greater than or equal to 90% of their needs    MONITOR:   Diet advancement, Weight trends  REASON FOR ASSESSMENT:   Malnutrition Screening Tool    ASSESSMENT:   Pt reports N/C, h/o of pancreatitis, peptic ulcers/gastritis, and lack of appetite; has felt bad for 6-8 months concomitant with approximate 20-24 pound weight loss; no clear explanation for weight loss or symptoms at this time.   Pt is 49 y.o white male who has been unable to work for around 8 months. He reported he "felt like shit" constantly in addition to COPD. Pt reports long term N/C/D and some vomiting, repeated hospitalizations. Has  H/o pancreatitis rt alcoholism but no longer consumes alcohol. History of peptic ulcer disease but does not flair up often. Blood in stool was reported upon admission. Iron, Ferritin, and Hematocrit labs were low along with a hgb of 7 in comparison to 13 upon previous admission.  NFPE findings included severely depleted fat at orbitals and throughout upper and lower body; severely depleted muscle at temples, acromion process, triceps, quadriceps, and calves. Approximate 20% weight loss over the course of 6-8 months related to constant nausea and lack of appetite.  Pt is 62% of ideal body weight and 83% of usual body weight. Even at his usual bodyweight of 118-120 lbs, pt is still 75% of ideal body weight.  Pt presents Malnutrition in context of chronic illness related to chronic nausea and lack of appetite, as evidenced by 20% weight loss over 6 months, and severe fat depletion and muscle  loss.   Diet Order:  Diet clear liquid Room service appropriate?: Yes; Fluid consistency:: Thin  Skin:  Reviewed, no issues  Last BM:  None since admission  Height:   Ht Readings from Last 1 Encounters:  07/28/15  (1.727 m)    Weight:   Wt Readings from Last 1 Encounters:  07/28/15 96 lb 3.2 oz (43.636 kg)    Ideal Body Weight:  70 kg  BMI:  Body mass index is 14.63 kg/(m^2).  Estimated Nutritional Needs:   Kcal:  1600-1800  Protein:  70-80g  Fluid:  At least 1.6L  EDUCATION NEEDS:   No education needs identified at this time  Romana Juniper, MS, RD, LDN

## 2015-07-28 NOTE — Clinical Documentation Improvement (Signed)
Hospitalist Can the diagnosis of blood loss anemia be further specified?   Acute Blood Loss Anemia, including the suspected or known cause or associated condition(s)  Acute on chronic blood loss anemia, including the suspected or known cause or associated condition(s)  Other  Clinically Undetermined  Document any associated diagnoses/conditions.  Supporting Information: ( As per notes)  "Anemia- Telemetry Monitoring Monitor H/Hs Send FOBT daily x 3 Transfuse 1 unit PRBCs IV Protonix Consult GI"  Component     Latest Ref Rng 07/27/2015 07/28/2015           WBC     4.0 - 10.5 K/uL 14.4 (H) 11.9 (H)  RBC     4.22 - 5.81 MIL/uL 2.33 (L) 2.13 (L)  Hemoglobin     13.0 - 17.0 g/dL 7.0 (L) 6.4 (LL)  HCT     39.0 - 52.0 % 22.4 (L) 20.3 (L)   Please exercise your independent, professional judgment when responding. A specific answer is not anticipated or expected.  Thank You, Nevin Bloodgood, RN, BSN, CCDS,Clinical Documentation Specialist:  [phone]  [phone]=[facility] [place]- [facility] Information Management

## 2015-07-29 ENCOUNTER — Inpatient Hospital Stay (HOSPITAL_COMMUNITY): Payer: 59

## 2015-07-29 ENCOUNTER — Encounter (HOSPITAL_COMMUNITY)
Admission: EM | Disposition: A | Discharge status: Home / Self Care | Payer: Self-pay | Source: Home / Self Care | Attending: Internal Medicine

## 2015-07-29 ENCOUNTER — Inpatient Hospital Stay (HOSPITAL_COMMUNITY): Payer: 59 | Admitting: Anesthesiology

## 2015-07-29 ENCOUNTER — Encounter (HOSPITAL_COMMUNITY): Payer: Self-pay

## 2015-07-29 DIAGNOSIS — R112 Nausea with vomiting, unspecified: Secondary | ICD-10-CM | POA: Diagnosis present

## 2015-07-29 HISTORY — PX: ESOPHAGOGASTRODUODENOSCOPY (EGD) WITH PROPOFOL: SHX5813

## 2015-07-29 LAB — TYPE AND SCREEN
ABO/RH(D): A POS
ANTIBODY SCREEN: NEGATIVE
UNIT DIVISION: 0
Unit division: 0

## 2015-07-29 LAB — HEMOGLOBIN AND HEMATOCRIT, BLOOD
HEMATOCRIT: 23.5 % — AB (ref 39.0–52.0)
HEMATOCRIT: 26.3 % — AB (ref 39.0–52.0)
HEMATOCRIT: 27.8 % — AB (ref 39.0–52.0)
HEMOGLOBIN: 8.7 g/dL — AB (ref 13.0–17.0)
HEMOGLOBIN: 9.2 g/dL — AB (ref 13.0–17.0)
Hemoglobin: 8 g/dL — ABNORMAL LOW (ref 13.0–17.0)

## 2015-07-29 LAB — LIPASE, BLOOD: LIPASE: 13 U/L — AB (ref 22–51)

## 2015-07-29 SURGERY — ESOPHAGOGASTRODUODENOSCOPY (EGD) WITH PROPOFOL
Anesthesia: Monitor Anesthesia Care

## 2015-07-29 MED ORDER — PROPOFOL INFUSION 10 MG/ML OPTIME
INTRAVENOUS | Status: DC | PRN
  Administered 2015-07-29: 75 ug/kg/min via INTRAVENOUS

## 2015-07-29 MED ORDER — IOHEXOL 300 MG/ML  SOLN
25.0000 mL | INTRAMUSCULAR | Status: AC
Start: 2015-07-29 — End: 2015-07-29
  Administered 2015-07-29 (×2): 25 mL via ORAL

## 2015-07-29 MED ORDER — MIDAZOLAM HCL 5 MG/5ML IJ SOLN
INTRAMUSCULAR | Status: DC | PRN
  Administered 2015-07-29: 2 mg via INTRAVENOUS

## 2015-07-29 MED ORDER — BUTAMBEN-TETRACAINE-BENZOCAINE 2-2-14 % EX AERO
INHALATION_SPRAY | CUTANEOUS | Status: DC | PRN
  Administered 2015-07-29: 2 via TOPICAL

## 2015-07-29 MED ORDER — SODIUM CHLORIDE 0.9 % IV SOLN
125.0000 mg | Freq: Every day | INTRAVENOUS | Status: DC
  Administered 2015-07-29 – 2015-08-01 (×4): 125 mg via INTRAVENOUS
  Filled 2015-07-29 (×10): qty 10

## 2015-07-29 MED ORDER — LACTATED RINGERS IV SOLN
INTRAVENOUS | Status: DC | PRN
  Administered 2015-07-29: 11:00:00 via INTRAVENOUS

## 2015-07-29 MED ORDER — EPHEDRINE SULFATE 50 MG/ML IJ SOLN
INTRAMUSCULAR | Status: DC | PRN
  Administered 2015-07-29: 15 mg via INTRAVENOUS

## 2015-07-29 MED ORDER — PROPOFOL 10 MG/ML IV BOLUS
INTRAVENOUS | Status: DC | PRN
Start: 2015-07-29 — End: 2015-07-29
  Administered 2015-07-29: 20 mg via INTRAVENOUS

## 2015-07-29 MED ORDER — IOHEXOL 300 MG/ML  SOLN
80.0000 mL | Freq: Once | INTRAMUSCULAR | Status: AC | PRN
  Administered 2015-07-29: 80 mL via INTRAVENOUS

## 2015-07-29 NOTE — Anesthesia Preprocedure Evaluation (Signed)
Anesthesia Evaluation  Patient identified by MRN, date of birth, ID band Patient awake    Reviewed: Allergy & Precautions, NPO status , Patient's Chart, lab work & pertinent test results  Airway Mallampati: II  TM Distance: >3 FB Neck ROM: Full    Dental no notable dental hx. (+) Poor Dentition   Pulmonary COPDCurrent Smoker,  breath sounds clear to auscultation  Pulmonary exam normal       Cardiovascular + Peripheral Vascular Disease negative cardio ROS Normal cardiovascular exam+ Valvular Problems/Murmurs Rhythm:Regular Rate:Normal     Neuro/Psych PSYCHIATRIC DISORDERS negative neurological ROS     GI/Hepatic Neg liver ROS, PUD, GERD-  ,  Endo/Other  negative endocrine ROS  Renal/GU negative Renal ROS     Musculoskeletal negative musculoskeletal ROS (+)   Abdominal   Peds  Hematology negative hematology ROS (+) anemia ,   Anesthesia Other Findings   Reproductive/Obstetrics negative OB ROS                             Anesthesia Physical Anesthesia Plan  ASA: III  Anesthesia Plan: MAC   Post-op Pain Management:    Induction: Intravenous  Airway Management Planned:   Additional Equipment:   Intra-op Plan:   Post-operative Plan:   Informed Consent: I have reviewed the patients History and Physical, chart, labs and discussed the procedure including the risks, benefits and alternatives for the proposed anesthesia with the patient or authorized representative who has indicated his/her understanding and acceptance.   Dental advisory given  Plan Discussed with: CRNA  Anesthesia Plan Comments:         Anesthesia Quick Evaluation

## 2015-07-29 NOTE — Transfer of Care (Signed)
Immediate Anesthesia Transfer of Care Note  Patient: Rickey Martin  Procedure(s) Performed: Procedure(s): ESOPHAGOGASTRODUODENOSCOPY (EGD) WITH PROPOFOL (N/A)  Patient Location: Endoscopy Unit  Anesthesia Type:MAC  Level of Consciousness: awake, alert  and oriented  Airway & Oxygen Therapy: Patient Spontanous Breathing and Patient connected to nasal cannula oxygen  Post-op Assessment: Report given to RN, Post -op Vital signs reviewed and stable and Patient moving all extremities  Post vital signs: Reviewed and stable  Last Vitals:  Filed Vitals:   07/29/15 0951  BP: 85/55  Pulse: 71  Temp: 36.9 C  Resp: 12    Complications: No apparent anesthesia complications

## 2015-07-29 NOTE — Interval H&P Note (Signed)
History and Physical Interval Note:  07/29/2015 10:58 AM  Rickey Martin  has presented today for surgery, with the diagnosis of abd pain  The various methods of treatment have been discussed with the patient and family. After consideration of risks, benefits and other options for treatment, the patient has consented to  Procedure(s): ESOPHAGOGASTRODUODENOSCOPY (EGD) WITH PROPOFOL (N/A) as a surgical intervention .  The patient's history has been reviewed, patient examined, no change in status, stable for surgery.  I have reviewed the patient's chart and labs.  Questions were answered to the patient's satisfaction.     [name] C.

## 2015-07-29 NOTE — H&P (View-Only) (Signed)
Referring Provider: Dr. York Spaniel Primary Care Physician:  Tommie Raymond, MD Primary Gastroenterologist:  Gentry Fitz  Reason for Consultation:  GI bleed; Abdominal pain  HPI: Rickey Martin is a 49 y.o. male with a history of peptic ulcer disease and pancreatitis being seen due to RUQ and epigastric pain for the past week that is severe and stabbing and feels like his pain from his pancreatitis. Reports multiple episodes of N/V throughout the day for the past 1.5 weeks. Has lost 20 pounds in the past 6 months. Reports having 3-4 red bloody stools prior to admit that lasted a day. Denies melena. Denies hematemesis. 20 pound unintentional weight loss in the past 6 months. Has been taking multiple Goody powder's per day for a week due to his abdominal pain. History of a peptic ulcer bleed in 2011 and had a clean-based ulcer in the stomach at that time. Heme negative. Hgb 7 (13 in June). Abd xray negative. 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 Reports cutting down on heavy alcohol use several years ago and reports drinking occasionally now. Has never had a colonoscopy. Wife at bedside.   Past Medical History  Diagnosis Date  . Pancreatitis   . AAA (abdominal aortic aneurysm)   . Gastric peptic ulcer   . Ulcer   . COPD (chronic obstructive pulmonary disease)   . Blood transfusion without reported diagnosis   . Heart valve disorder "leaky heart valve"    Past Surgical History  Procedure Laterality Date  . Abdominal aortic aneurysm repair    . Abdominal aortic aneurysm repair  stent    Prior to Admission medications   Not on File    Scheduled Meds: . sodium chloride   Intravenous Once  . antiseptic oral rinse  7 mL Mouth Rinse BID  . feeding supplement  1 Container Oral TID BM  . nicotine  21 mg Transdermal Daily  . pantoprazole (PROTONIX) IV  40 mg Intravenous Q12H  . sodium chloride  3 mL Intravenous Q12H  . sucralfate  1 g Oral TID WC & HS    Continuous Infusions: . sodium chloride 50 mL/hr (07/28/15 1449)   PRN Meds:.acetaminophen **OR** acetaminophen, alum & mag hydroxide-simeth, HYDROmorphone (DILAUDID) injection, ondansetron **OR** ondansetron (ZOFRAN) IV, oxyCODONE  Allergies as of 07/27/2015  . (No Known Allergies)    History reviewed. No pertinent family history.  Social History   Social History  . Marital Status: Single    Spouse Name: N/A  . Number of Children: N/A  . Years of Education: N/A   Occupational History  . Not on file.   Social History Main Topics  . Smoking status: Current Every Day Smoker -- 1.50 packs/day  . Smokeless tobacco: Never Used  . Alcohol Use: Yes     Comment: admits to drinking beer last week  . Drug Use: No  . Sexual Activity: Not on file   Other Topics Concern  . Not on file   Social History Narrative    Review of Systems: All negative except as stated above in HPI.  Physical Exam: Vital signs: Filed Vitals:   07/28/15 1655  BP: 88/52  Pulse: 63  Temp: 97.8 F (36.6 C)  Resp: 16   Last BM Date: 07/27/15 General:   Alert,  Thin, disheveled, no acute distress, tattoos noted on arms Head: atraumatic Eyes: pupils equal and reactive, anicteric sclera ENT: oropharynx clear Neck: supple, nontender Lungs:  Clear throughout to auscultation.   No wheezes, crackles,  or rhonchi. No acute distress. Heart:  Regular rate and rhythm; no murmurs, clicks, rubs,  or gallops. Abdomen: RUQ and epigastric tenderness with guarding, flat, nondistended, +BS Rectal:  Deferred Ext: pedal pulses intact, no edema  GI:  Lab Results:  Recent Labs  07/27/15 2310 07/28/15 0457 07/28/15 1200  WBC 14.4* 11.9*  --   HGB 7.0* 6.4* 7.6*  HCT 22.4* 20.3* 23.9*  PLT 852* 633*  --    BMET  Recent Labs  07/27/15 2310 07/28/15 0457  NA 136 136  K 3.9 3.9  CL 100* 105  CO2 22 21*  GLUCOSE 128* 82  BUN 7 6  CREATININE 0.63 0.66  CALCIUM 8.7* 7.8*   LFT  Recent Labs   07/27/15 2310  PROT 6.3*  ALBUMIN 2.9*  AST 19  ALT 8*  ALKPHOS 60  BILITOT 0.1*   PT/INR  Recent Labs  07/27/15 2310  LABPROT 14.1  INR 1.07     Studies/Results: Dg Abd Acute W/chest  07/28/2015   CLINICAL DATA:  Acute onset of upper abdominal pain for 1 week. Initial encounter.  EXAM: DG ABDOMEN ACUTE W/ 1V CHEST  COMPARISON:  Chest radiograph performed 03/07/2014, and CT of the abdomen and pelvis from 05/19/2015  FINDINGS: The lungs are well-aerated. Mild chronic peribronchial thickening is noted. There is no evidence of focal opacification, pleural effusion or pneumothorax. The cardiomediastinal silhouette is within normal limits.  The visualized bowel gas pattern is unremarkable. Scattered fluid and air are seen within the colon; there is no evidence of small bowel dilatation to suggest obstruction. No free intra-abdominal air is identified on the provided upright view. Coiling is noted at the left upper quadrant.  No acute osseous abnormalities are seen; the sacroiliac joints are unremarkable in appearance.  IMPRESSION: 1. Scattered fluid and air within the colon, without evidence for obstruction. No free intra-abdominal air seen. 2. Mild chronic peribronchial thickening noted. Lungs otherwise clear.   Electronically Signed   By: Roanna Raider M.D.   On: 07/28/2015 00:19    Impression/Plan: 49 yo with history of pancreatitis and peptic ulcer disease presents with severe RUQ and epigastric abdominal pain with nausea and vomiting. Episodes of hematochezia prior to admit without melena. He also has been using a lot of Goody Powders recently. DDx peptic ulcer disease vs pancreatic pseudocyst with mass effect on stomach. Agree with blood transfusion. Clear liquids. NPO after midnight. EGD tomorrow to assess for PUD and if unrevealing then he may need an updated CT to look for worsening pseudocysts. He likely has chronic pancreatitis as well hence the normal Lipase. If CT and EGD  unrevealing, then may need an inpatient colonoscopy otherwise if he stabilizes then can do colonoscopy as he reportedly has previously been scheduled to have it next week.    LOS: 0 days   Nirvana Blanchett C.  07/28/2015, 5:33 PM  Pager [phone]  If no answer or after 5 PM call [phone]

## 2015-07-29 NOTE — Brief Op Note (Signed)
Minimal gastritis otherwise normal EGD. NO ulcers seen. Abd CT needed to evaluate for worsening pancreatic pseudocysts. Keep NPO until CT complete and then start clear liquid diet.

## 2015-07-29 NOTE — Progress Notes (Signed)
[facility] PROGRESS NOTE  Rickey Martin ZOX:[id number] DOB: 1966/07/03 DOA: 07/27/2015 PCP: Tommie Raymond, MD  Assessment/Plan: 49 y/o male with PMH of COPD, Ongoing tobacco use, Anemia, pancreatitis, h/o PUD presented with epigastric abdominal pains, nausea, weight loss. Found to have severe symptomatic anemia.  -admitted for evaluation for anemia, abdominal pains   1. Abdominal pains of unclear etiology.  H/o PUD.  -patient underwent EGD (8/19), which showed mild gastritis. Will obtain CT abd for further evaluation.  cont PPI, sucralfate. appreciate GI input, who plans colonoscopy  2. Symptomatic anemia. Severe IDA. ?chronic Blood loss. No s/s of active bleeding.  -Tfsed 2 unit. Hg improved. We will transfuse as needed. Monitor closely. Start IV iron  3. COPD (chronic obstructive pulmonary disease). No wheezing on exam. Cont Duonebs PRN 4. Tobacco use disorder. Counseled to stop smoking. Nicotine Patch daily    Code Status: full Family Communication: d/w patient, RN, his family  (indicate person spoken with, relationship, and if by phone, the number) Disposition Plan: home 1-2 days    Consultants:  GI  Procedures:  none  Antibiotics:  none (indicate start date, and stop date if known)  HPI/Subjective: Alert   Objective: Filed Vitals:   07/29/15 1150  BP: 92/57  Pulse: 63  Temp:   Resp: 14    Intake/Output Summary (Last 24 hours) at 07/29/15 1218 Last data filed at 07/29/15 1111  Gross per 24 hour  Intake 2674.17 ml  Output      0 ml  Net 2674.17 ml   Filed Weights   07/28/15 0248 07/28/15 2116 07/29/15 0951  Weight: 43.636 kg (96 lb 3.2 oz) 42.95 kg (94 lb 11 oz) 42.638 kg (94 lb)    Exam:   General:  No distress   Cardiovascular: s1,s2 rrr  Respiratory: CTA BL  Abdomen: soft, nt,nd   Musculoskeletal: no leg edema   Data Reviewed: Basic Metabolic Panel:  Recent Labs Lab 07/27/15 2310 07/28/15 0457  NA 136 136  K 3.9 3.9  CL  100* 105  CO2 22 21*  GLUCOSE 128* 82  BUN 7 6  CREATININE 0.63 0.66  CALCIUM 8.7* 7.8*   Liver Function Tests:  Recent Labs Lab 07/27/15 2310  AST 19  ALT 8*  ALKPHOS 60  BILITOT 0.1*  PROT 6.3*  ALBUMIN 2.9*    Recent Labs Lab 07/27/15 2310 07/29/15 0411  LIPASE 13* 13*   No results for input(s): AMMONIA in the last 168 hours. CBC:  Recent Labs Lab 07/27/15 2310 07/28/15 0457 07/28/15 1200 07/28/15 2044 07/29/15 0411  WBC 14.4* 11.9*  --   --   --   HGB 7.0* 6.4* 7.6* 8.5* 8.0*  HCT 22.4* 20.3* 23.9* 25.4* 23.5*  MCV 96.1 95.3  --   --   --   PLT 852* 633*  --   --   --    Cardiac Enzymes: No results for input(s): CKTOTAL, CKMB, CKMBINDEX, TROPONINI in the last 168 hours. BNP (last 3 results) No results for input(s): BNP in the last 8760 hours.  ProBNP (last 3 results) No results for input(s): PROBNP in the last 8760 hours.  CBG: No results for input(s): GLUCAP in the last 168 hours.  No results found for this or any previous visit (from the past 240 hour(s)).   Studies: Dg Abd Acute W/chest  07/28/2015   CLINICAL DATA:  Acute onset of upper abdominal pain for 1 week. Initial encounter.  EXAM: DG ABDOMEN ACUTE W/ 1V CHEST  COMPARISON:  Chest radiograph performed 03/07/2014, and CT of the abdomen and pelvis from 05/19/2015  FINDINGS: The lungs are well-aerated. Mild chronic peribronchial thickening is noted. There is no evidence of focal opacification, pleural effusion or pneumothorax. The cardiomediastinal silhouette is within normal limits.  The visualized bowel gas pattern is unremarkable. Scattered fluid and air are seen within the colon; there is no evidence of small bowel dilatation to suggest obstruction. No free intra-abdominal air is identified on the provided upright view. Coiling is noted at the left upper quadrant.  No acute osseous abnormalities are seen; the sacroiliac joints are unremarkable in appearance.  IMPRESSION: 1. Scattered fluid and  air within the colon, without evidence for obstruction. No free intra-abdominal air seen. 2. Mild chronic peribronchial thickening noted. Lungs otherwise clear.   Electronically Signed   By: Roanna Raider M.D.   On: 07/28/2015 00:19    Scheduled Meds: . sodium chloride   Intravenous Once  . antiseptic oral rinse  7 mL Mouth Rinse BID  . feeding supplement  1 Container Oral TID BM  . ferric gluconate (FERRLECIT/NULECIT) IV  125 mg Intravenous Daily  . nicotine  21 mg Transdermal Daily  . pantoprazole (PROTONIX) IV  40 mg Intravenous Q12H  . sodium chloride  3 mL Intravenous Q12H  . sucralfate  1 g Oral TID WC & HS   Continuous Infusions: . sodium chloride Stopped (07/29/15 1610)    Active Problems:   Anemia   Failure to thrive in adult   COPD (chronic obstructive pulmonary disease)   Tobacco use disorder   Abdominal pain   Absolute anemia   Nausea and vomiting    Time spent: >35 minutes     Esperanza Sheets  [facility] Pager [phone]. If 7PM-7AM, please contact night-coverage at [email or web address], password [facility] 07/29/2015, 12:18 PM  LOS: 1 day

## 2015-07-29 NOTE — Anesthesia Postprocedure Evaluation (Signed)
Anesthesia Post Note  Patient: Rickey Martin  Procedure(s) Performed: Procedure(s) (LRB): ESOPHAGOGASTRODUODENOSCOPY (EGD) WITH PROPOFOL (N/A)  Anesthesia type: MAC  Patient location: PACU  Post pain: Pain level controlled  Post assessment: Post-op Vital signs reviewed  Last Vitals: BP 92/57 mmHg  Pulse 63  Temp(Src) 36.9 C (Oral)  Resp 14  Ht  (1.727 m)  Wt 94 lb (42.638 kg)  BMI 14.30 kg/m2  SpO2 96%  Post vital signs: Reviewed  Level of consciousness: awake  Complications: No apparent anesthesia complications

## 2015-07-29 NOTE — Op Note (Signed)
[facility] [address] [place], [address]   ENDOSCOPY PROCEDURE REPORT  PATIENT: Rickey Martin, Rickey Martin  MR#: [id number] BIRTHDATE: 03-13-1966 , 48  yrs. old GENDER: male ENDOSCOPIST: Charlott Rakes, MD REFERRED BY: PROCEDURE DATE:  Aug 19, 2015 PROCEDURE:  EGD, diagnostic ASA CLASS:     Class III INDICATIONS:  nausea, vomiting, and abdominal pain in the upper right quadrant. MEDICATIONS: Propofol, Per Anesthesia , and Monitored anesthesia care TOPICAL ANESTHETIC:  DESCRIPTION OF PROCEDURE: After the risks benefits and alternatives of the procedure were thoroughly explained, informed consent was obtained.  The    endoscope was introduced through the mouth and advanced to the second portion of the duodenum , Without limitations.  The instrument was slowly withdrawn as the mucosa was fully examined. Estimated blood loss is zero unless otherwise noted in this procedure report.    Esophagus and GEJ normal (42 cm from the incisors). Minimal distal erythema of the stomach c/w minimal antral gastritis. Stomach otherwise normal in appearance. Duodenal bulb and 2nd portion of the duodenum normal in appearance. Clear bilious fluid noted in the examined duodenum.       Retroflexed views revealed no abnormalities.     The scope was then withdrawn from the patient and the procedure completed.  COMPLICATIONS: There were no immediate complications.  ENDOSCOPIC IMPRESSION:     Minimal antral gastritis otherwise normal EGD  RECOMMENDATIONS:     Abd CT to look for pancreatic pseudocysts; Keep NPO until CT done; Supportive care   eSigned:  Charlott Rakes, MD 2015/08/19 11:19 AM    CC:  CPT CODES: ICD CODES:  The ICD and CPT codes recommended by this software are interpretations from the data that the clinical staff has captured with the software.  The verification of the translation of this report to the ICD and CPT codes and modifiers is the  sole responsibility of the health care institution and practicing physician where this report was generated.  [employer] will not be held responsible for the validity of the ICD and CPT codes included on this report.  AMA assumes no liability for data contained or not contained herein. CPT is a Publishing rights manager of the [employer].  PATIENT NAME:  Rickey Martin, Rickey Martin MR#: [id number]

## 2015-07-29 NOTE — Anesthesia Procedure Notes (Signed)
Procedure Name: MAC Date/Time: 07/29/2015 11:00 AM Performed by: Orvilla Fus A Pre-anesthesia Checklist: Patient identified, Emergency Drugs available, Suction available, Patient being monitored and Timeout performed Patient Re-evaluated:Patient Re-evaluated prior to inductionOxygen Delivery Method: Nasal cannula Placement Confirmation: positive ETCO2 Dental Injury: Teeth and Oropharynx as per pre-operative assessment

## 2015-07-30 ENCOUNTER — Inpatient Hospital Stay (HOSPITAL_COMMUNITY): Payer: 59

## 2015-07-30 DIAGNOSIS — R627 Adult failure to thrive: Secondary | ICD-10-CM

## 2015-07-30 DIAGNOSIS — Z720 Tobacco use: Secondary | ICD-10-CM

## 2015-07-30 DIAGNOSIS — J410 Simple chronic bronchitis: Secondary | ICD-10-CM

## 2015-07-30 DIAGNOSIS — R1013 Epigastric pain: Secondary | ICD-10-CM

## 2015-07-30 LAB — CBC
HEMATOCRIT: 26.7 % — AB (ref 39.0–52.0)
Hemoglobin: 8.8 g/dL — ABNORMAL LOW (ref 13.0–17.0)
MCH: 30.3 pg (ref 26.0–34.0)
MCHC: 33 g/dL (ref 30.0–36.0)
MCV: 92.1 fL (ref 78.0–100.0)
Platelets: 511 10*3/uL — ABNORMAL HIGH (ref 150–400)
RBC: 2.9 MIL/uL — ABNORMAL LOW (ref 4.22–5.81)
RDW: 16.1 % — AB (ref 11.5–15.5)
WBC: 17.7 10*3/uL — AB (ref 4.0–10.5)

## 2015-07-30 LAB — LIPASE, BLOOD: Lipase: 13 U/L — ABNORMAL LOW (ref 22–51)

## 2015-07-30 MED ORDER — METRONIDAZOLE IN NACL 5-0.79 MG/ML-% IV SOLN
500.0000 mg | Freq: Three times a day (TID) | INTRAVENOUS | Status: DC
  Administered 2015-07-30 – 2015-08-02 (×9): 500 mg via INTRAVENOUS
  Filled 2015-07-30 (×8): qty 100

## 2015-07-30 MED ORDER — ALBUTEROL SULFATE (2.5 MG/3ML) 0.083% IN NEBU
2.5000 mg | INHALATION_SOLUTION | RESPIRATORY_TRACT | Status: DC | PRN
  Administered 2015-07-30 – 2015-08-01 (×2): 2.5 mg via RESPIRATORY_TRACT
  Filled 2015-07-30 (×2): qty 3

## 2015-07-30 MED ORDER — ALBUTEROL SULFATE HFA 108 (90 BASE) MCG/ACT IN AERS: 2.0000 | INHALATION_SPRAY | RESPIRATORY_TRACT | Status: DC | PRN

## 2015-07-30 MED ORDER — LEVOFLOXACIN IN D5W 750 MG/150ML IV SOLN
750.0000 mg | INTRAVENOUS | Status: DC
  Administered 2015-07-30 – 2015-08-01 (×3): 750 mg via INTRAVENOUS
  Filled 2015-07-30 (×3): qty 150

## 2015-07-30 NOTE — Progress Notes (Signed)
[facility] Progress Note  Subjective: No specific complaints today. Had EGD yesterday. He would like to get his colonoscopy on Monday.  Objective: Vital signs in last 24 hours: Temp:  [97.8 F (36.6 C)-100.1 F (37.8 C)] 97.8 F (36.6 C) (08/20 0831) Pulse Rate:  [63-88] 78 (08/20 0831) Resp:  [14-27] 18 (08/20 0831) BP: (82-112)/(49-65) 93/50 mmHg (08/20 0831) SpO2:  [90 %-100 %] 94 % (08/20 0831) Weight:  [43.2 kg (95 lb 3.8 oz)] 43.2 kg (95 lb 3.8 oz) (08/19 2100) Weight change: -0.312 kg (-11 oz)   PE:  No distress  Heart regular rhythm  Lungs clear  Abdomen soft and nontender  He had the CT scan of the abdomen done still has his pseudocyst.  Lab Results: Results for orders placed or performed during the hospital encounter of 07/27/15 (from the past 24 hour(s))  Hemoglobin and hematocrit, blood     Status: Abnormal   Collection Time: 07/29/15 12:36 PM  Result Value Ref Range   Hemoglobin 8.7 (L) 13.0 - 17.0 g/dL   HCT 16.1 (L) 09.6 - 04.5 %  Hemoglobin and hematocrit, blood     Status: Abnormal   Collection Time: 07/29/15  8:19 PM  Result Value Ref Range   Hemoglobin 9.2 (L) 13.0 - 17.0 g/dL   HCT 40.9 (L) 81.1 - 91.4 %  Lipase, blood     Status: Abnormal   Collection Time: 07/30/15  4:35 AM  Result Value Ref Range   Lipase 13 (L) 22 - 51 U/L  CBC     Status: Abnormal   Collection Time: 07/30/15  4:35 AM  Result Value Ref Range   WBC 17.7 (H) 4.0 - 10.5 K/uL   RBC 2.90 (L) 4.22 - 5.81 MIL/uL   Hemoglobin 8.8 (L) 13.0 - 17.0 g/dL   HCT 78.2 (L) 95.6 - 21.3 %   MCV 92.1 78.0 - 100.0 fL   MCH 30.3 26.0 - 34.0 pg   MCHC 33.0 30.0 - 36.0 g/dL   RDW 08.6 (H) 57.8 - 46.9 %   Platelets 511 (H) 150 - 400 K/uL    Studies/Results: Ct Abdomen Pelvis W Contrast  07/29/2015   CLINICAL DATA:  Right upper quadrant pain for several weeks. History pancreatitis. History of pseudo cysts. History of aneurysm clip in 2010 within abdomen.  EXAM: CT ABDOMEN AND  PELVIS WITH CONTRAST  TECHNIQUE: Multidetector CT imaging of the abdomen and pelvis was performed using the standard protocol following bolus administration of intravenous contrast.  CONTRAST:  80mL OMNIPAQUE IOHEXOL 300 MG/ML  SOLN  COMPARISON:  05/29/2015  FINDINGS: Lower chest: Scarring at the left lung base. Mild cardiomegaly with small bilateral pleural effusions. periesophageal cystic lesion measures 2.4 x 2.9 cm and is decreased from 6.0 x 7.7 cm on the prior exam.  Hepatobiliary: Beam hardening artifact throughout the abdomen, centered in the left upper quadrant, presumably from embolization coils. Focal steatosis adjacent the falciform ligament. Gallbladder otherwise unremarkable. Normal gallbladder, without biliary ductal dilatation.  Pancreas: the pancreatic tail and upstream body are not well evaluated. This cystic lesion within the pancreatic neck/ body junction has nearly completely resolved. 6 mm on image 22 versus 1.8 cm on the prior. No convincing evidence of residual peripancreatic inflammation. No pancreatic duct dilatation.  Spleen: Normal  Adrenals/Urinary Tract: Left adrenal thickening. Normal right adrenal gland. Normal kidneys, without hydronephrosis. Normal urinary bladder.  Stomach/Bowel: Underdistended stomach. Greater curvature gastric wall thickening at 3.0 cm within the body. Scattered colonic diverticula. Sigmoid wall thickening  is likely due to muscular hypertrophy. Normal terminal ileum and appendix. Normal small bowel.  Vascular/Lymphatic: Aortic and branch vessel atherosclerosis. Gastroepiploic collaterals are likely due to chronic splenic vein insufficiency. An 8 mm aortocaval node is decreased in size from 11 mm on the prior and likely reactive. No pelvic adenopathy.  Reproductive: Normal prostate.  Other: Anasarca. Increase in small volume cul-de-sac fluid, simple. Trace perihepatic ascites with ill-defined fluid or edema in the porta hepatis.  Musculoskeletal: Mild osteopenia.  Remote right iliac trauma or surgery. Remote right posterior medial twelfth rib trauma. A moderate compression deformity at L1 is chronic.  IMPRESSION: 1. Degraded evaluation of the upper abdomen, especially left upper quadrant, secondary to beam hardening artifact from presumed embolization coils. 2. Decrease in size of likely communicating pseudocysts within the lower chest and pancreatic body/neck junction. 3. Gastric wall thickening, superimposed upon underdistention. Suspicious for gastritis. Gastric malignancy cannot be excluded but is felt less likely. 4. Small volume abdominal pelvic fluid with anasarca and small bilateral pleural effusions. Question fluid overload. 5. 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   Electronically Signed   By: Jeronimo Greaves M.D.   On: 07/29/2015 17:21   Dg Chest Port 1 View  07/30/2015   CLINICAL DATA:  Cough and short of breath  EXAM: PORTABLE CHEST - 1 VIEW  COMPARISON:  07/27/2015  FINDINGS: Hazy airspace disease is present in the inferior right upper lobe and left mid lung zone. Airspace disease at the left base. Lungs are hyper aerated. Normal heart size. No pneumothorax.  IMPRESSION: Bilateral airspace disease worrisome for bilateral pneumonia or atypical pulmonary edema. Followup PA and lateral chest X-ray is recommended in 3-4 weeks following trial of antibiotic therapy to ensure resolution and exclude underlying malignancy.   Electronically Signed   By: Jolaine Click M.D.   On: 07/30/2015 09:03      Assessment: Anemia  History of chronic pancreatitis with pancreatic pseudocyst. This could be the source of his chronic abdominal pain  Plan:   We will plan to do colonoscopy on Monday.    SAM F Lanier Millon 07/30/2015, 10:20 AM  Pager: [phone] If no answer or after 5 PM call [phone]

## 2015-07-30 NOTE — Progress Notes (Signed)
ANTIBIOTIC CONSULT NOTE - INITIAL  Pharmacy Consult for Levaquin Indication: pneumonia  No Known Allergies  Patient Measurements: Height:  (172.7 cm) Weight: 95 lb 3.8 oz (43.2 kg) IBW/kg (Calculated) : 68.4 Adjusted Body Weight:    Vital Signs: Temp: 97.8 F (36.6 C) (08/20 0831) Temp Source: Oral (08/20 0831) BP: 93/50 mmHg (08/20 0831) Pulse Rate: 78 (08/20 0831) Intake/Output from previous day: 08/19 0701 - 08/20 0700 In: 951.7 [P.O.:360; I.V.:481.7; IV Piggyback:110] Out: 200 [Urine:200] Intake/Output from this shift:    Labs:  Recent Labs  07/27/15 2310 07/28/15 0457  07/29/15 1236 07/29/15 2019 07/30/15 0435  WBC 14.4* 11.9*  --   --   --  17.7*  HGB 7.0* 6.4*  < > 8.7* 9.2* 8.8*  PLT 852* 633*  --   --   --  511*  CREATININE 0.63 0.66  --   --   --   --   < > = values in this interval not displayed. Estimated Creatinine Clearance: 69 mL/min (by C-G formula based on Cr of 0.66). No results for input(s): VANCOTROUGH, VANCOPEAK, VANCORANDOM, GENTTROUGH, GENTPEAK, GENTRANDOM, TOBRATROUGH, TOBRAPEAK, TOBRARND, AMIKACINPEAK, AMIKACINTROU, AMIKACIN in the last 72 hours.   Microbiology: No results found for this or any previous visit (from the past 720 hour(s)).  Medical History: Past Medical History  Diagnosis Date  . Pancreatitis   . AAA (abdominal aortic aneurysm)   . Gastric peptic ulcer   . COPD (chronic obstructive pulmonary disease)   . Blood transfusion without reported diagnosis   . Heart valve disorder "leaky heart valve"  . History of blood transfusion     "w/stomach ulcer and today" (07/28/2015)  . Anemia   . GERD (gastroesophageal reflux disease)     Medications:  No prescriptions prior to admission   Assessment: 49 y/o male with PMH of COPD, Ongoing tobacco use, Anemia, pancreatitis, h/o PUD presented with epigastric abdominal pains, nausea, weight loss. Found to have severe symptomatic anemia.   ID: Start abx for leukocytosis 17.7.  Tamx 100.1, Currently afebrile. CrCl 70. 8/20 CXR: Bilateral airspace disease worrisome for bilateral pneumonia or atypical pulmonary edema.  GI: h/u PUD, chronic alcoholic pancreatitis with pseudocyst. EGD 8/19 with mild gastritis. IV PPI, sucralfate. Colonoscopy Monday.  Heme/Onc: Symptomatic anemia: s/p 2 U PRBC 8/18.    Goal of Therapy:  Eradication of infection   Plan:  Levaquin  IV q24h for HCAP with CrCl 70 Pharmacy will sign off. Please reconsult for further dosing assitance.    Everette Mall S. Merilynn Finland, PharmD, BCPS Clinical Staff Pharmacist Pager [phone]  Misty Stanley Stillinger 07/30/2015,12:07 PM

## 2015-07-30 NOTE — Progress Notes (Addendum)
[facility] PROGRESS NOTE  Rickey Martin ZOX:[id number] DOB: [birth year] DOA: 07/27/2015 PCP: Tommie Raymond, MD  Assessment/Plan: 49 y/o male with PMH of COPD, Ongoing tobacco use, Anemia, pancreatitis, h/o PUD presented with epigastric abdominal pains, nausea, weight loss. Found to have severe symptomatic anemia.  -admitted for evaluation for anemia, abdominal pains   1. Abdominal pains -   H/o PUD. History of chronic alcoholic pancreatitis with pseudocyst - patient underwent EGD (8/19), which showed mild gastritis. Abdominal CT scan shows improving pseudocyst.  cont PPI, sucralfate. appreciate GI input, who plans colonoscopy. An tinea supportive care for now. 2. Symptomatic anemia likely chronic GI blood loss. Severe IDA. ?chronic Blood loss. No s/s of active bleeding. Tfsed 2 unit on 07/28/2015. HB improved. We will transfuse as needed. Monitor closely. Started on IV iron . 3. COPD (chronic obstructive pulmonary disease). No wheezing on exam. Cont Duonebs PRN 4. Tobacco use disorder. Counseled to stop smoking. Nicotine Patch daily 5. Elevated leukocytosis with possible pneumonia. Placed on Levaquin and Flagyl will monitor. Does have leukocytosis will monitor closely.   Code Status: full Family Communication: Discussed with patient and wife bedside Disposition Plan: home 1-2 days    Consultants:  GI  Procedures:  EGD - mild gastritis  CT scan abdomen and pelvis. Proven pancreatic pseudocyst  Coloscopy planned  Antibiotics:  none (indicate start date, and stop date if known)  HPI/Subjective: Alert   Objective: Filed Vitals:   07/30/15 0831  BP: 93/50  Pulse: 78  Temp: 97.8 F (36.6 C)  Resp: 18    Intake/Output Summary (Last 24 hours) at 07/30/15 1204 Last data filed at 07/30/15 0700  Gross per 24 hour  Intake    470 ml  Output    200 ml  Net    270 ml   Filed Weights   07/28/15 2116 07/29/15 0951 07/29/15 2100  Weight: 42.95 kg (94 lb 11 oz)  42.638 kg (94 lb) 43.2 kg (95 lb 3.8 oz)    Exam:   General:  No distress   Cardiovascular: s1,s2 rrr  Respiratory: CTA BL  Abdomen: soft, nt,nd   Musculoskeletal: no leg edema   Data Reviewed: Basic Metabolic Panel:  Recent Labs Lab 07/27/15 2310 07/28/15 0457  NA 136 136  K 3.9 3.9  CL 100* 105  CO2 22 21*  GLUCOSE 128* 82  BUN 7 6  CREATININE 0.63 0.66  CALCIUM 8.7* 7.8*   Liver Function Tests:  Recent Labs Lab 07/27/15 2310  AST 19  ALT 8*  ALKPHOS 60  BILITOT 0.1*  PROT 6.3*  ALBUMIN 2.9*    Recent Labs Lab 07/27/15 2310 07/29/15 0411 07/30/15 0435  LIPASE 13* 13* 13*   No results for input(s): AMMONIA in the last 168 hours. CBC:  Recent Labs Lab 07/27/15 2310 07/28/15 0457  07/28/15 2044 07/29/15 0411 07/29/15 1236 07/29/15 2019 07/30/15 0435  WBC 14.4* 11.9*  --   --   --   --   --  17.7*  HGB 7.0* 6.4*  < > 8.5* 8.0* 8.7* 9.2* 8.8*  HCT 22.4* 20.3*  < > 25.4* 23.5* 26.3* 27.8* 26.7*  MCV 96.1 95.3  --   --   --   --   --  92.1  PLT 852* 633*  --   --   --   --   --  511*  < > = values in this interval not displayed. Cardiac Enzymes: No results for input(s): CKTOTAL, CKMB, CKMBINDEX, TROPONINI in the  last 168 hours. BNP (last 3 results) No results for input(s): BNP in the last 8760 hours.  ProBNP (last 3 results) No results for input(s): PROBNP in the last 8760 hours.  CBG: No results for input(s): GLUCAP in the last 168 hours.  No results found for this or any previous visit (from the past 240 hour(s)).   Studies: Ct Abdomen Pelvis W Contrast  07/29/2015   CLINICAL DATA:  Right upper quadrant pain for several weeks. History pancreatitis. History of pseudo cysts. History of aneurysm clip in 2010 within abdomen.  EXAM: CT ABDOMEN AND PELVIS WITH CONTRAST  TECHNIQUE: Multidetector CT imaging of the abdomen and pelvis was performed using the standard protocol following bolus administration of intravenous contrast.  CONTRAST:   80mL OMNIPAQUE IOHEXOL 300 MG/ML  SOLN  COMPARISON:  05/29/2015  FINDINGS: Lower chest: Scarring at the left lung base. Mild cardiomegaly with small bilateral pleural effusions. periesophageal cystic lesion measures 2.4 x 2.9 cm and is decreased from 6.0 x 7.7 cm on the prior exam.  Hepatobiliary: Beam hardening artifact throughout the abdomen, centered in the left upper quadrant, presumably from embolization coils. Focal steatosis adjacent the falciform ligament. Gallbladder otherwise unremarkable. Normal gallbladder, without biliary ductal dilatation.  Pancreas: the pancreatic tail and upstream body are not well evaluated. This cystic lesion within the pancreatic neck/ body junction has nearly completely resolved. 6 mm on image 22 versus 1.8 cm on the prior. No convincing evidence of residual peripancreatic inflammation. No pancreatic duct dilatation.  Spleen: Normal  Adrenals/Urinary Tract: Left adrenal thickening. Normal right adrenal gland. Normal kidneys, without hydronephrosis. Normal urinary bladder.  Stomach/Bowel: Underdistended stomach. Greater curvature gastric wall thickening at 3.0 cm within the body. Scattered colonic diverticula. Sigmoid wall thickening is likely due to muscular hypertrophy. Normal terminal ileum and appendix. Normal small bowel.  Vascular/Lymphatic: Aortic and branch vessel atherosclerosis. Gastroepiploic collaterals are likely due to chronic splenic vein insufficiency. An 8 mm aortocaval node is decreased in size from 11 mm on the prior and likely reactive. No pelvic adenopathy.  Reproductive: Normal prostate.  Other: Anasarca. Increase in small volume cul-de-sac fluid, simple. Trace perihepatic ascites with ill-defined fluid or edema in the porta hepatis.  Musculoskeletal: Mild osteopenia. Remote right iliac trauma or surgery. Remote right posterior medial twelfth rib trauma. A moderate compression deformity at L1 is chronic.  IMPRESSION: 1. Degraded evaluation of the upper  abdomen, especially left upper quadrant, secondary to beam hardening artifact from presumed embolization coils. 2. Decrease in size of likely communicating pseudocysts within the lower chest and pancreatic body/neck junction. 3. Gastric wall thickening, superimposed upon underdistention. Suspicious for gastritis. Gastric malignancy cannot be excluded but is felt less likely. 4. Small volume abdominal pelvic fluid with anasarca and small bilateral pleural effusions. Question fluid overload. 5. 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   Electronically Signed   By: Jeronimo Greaves M.D.   On: 07/29/2015 17:21   Dg Chest Port 1 View  07/30/2015   CLINICAL DATA:  Cough and short of breath  EXAM: PORTABLE CHEST - 1 VIEW  COMPARISON:  07/27/2015  FINDINGS: Hazy airspace disease is present in the inferior right upper lobe and left mid lung zone. Airspace disease at the left base. Lungs are hyper aerated. Normal heart size. No pneumothorax.  IMPRESSION: Bilateral airspace disease worrisome for bilateral pneumonia or atypical pulmonary edema. Followup PA and lateral chest X-ray is recommended in 3-4 weeks following trial of antibiotic therapy to ensure resolution and exclude underlying malignancy.  Electronically Signed   By: Jolaine Click M.D.   On: 07/30/2015 09:03    Scheduled Meds: . sodium chloride   Intravenous Once  . antiseptic oral rinse  7 mL Mouth Rinse BID  . feeding supplement  1 Container Oral TID BM  . ferric gluconate (FERRLECIT/NULECIT) IV  125 mg Intravenous Daily  . nicotine  21 mg Transdermal Daily  . pantoprazole (PROTONIX) IV  40 mg Intravenous Q12H  . sodium chloride  3 mL Intravenous Q12H  . sucralfate  1 g Oral TID WC & HS   Continuous Infusions: . sodium chloride Stopped (07/29/15 1610)    Active Problems:   Anemia   Failure to thrive in adult   COPD (chronic obstructive pulmonary disease)   Tobacco use disorder   Abdominal pain   Absolute anemia    Nausea and vomiting    Time spent: >35 minutes     [facility]  [facility] Pager [phone]. If 7PM-7AM, please contact night-coverage at [email or web address], password [facility] 07/30/2015, 12:04 PM  LOS: 2 days

## 2015-07-31 ENCOUNTER — Inpatient Hospital Stay (HOSPITAL_COMMUNITY): Payer: 59

## 2015-07-31 LAB — COMPREHENSIVE METABOLIC PANEL
ALT: 7 U/L — AB (ref 17–63)
AST: 16 U/L (ref 15–41)
Albumin: 1.7 g/dL — ABNORMAL LOW (ref 3.5–5.0)
Alkaline Phosphatase: 58 U/L (ref 38–126)
Anion gap: 7 (ref 5–15)
BILIRUBIN TOTAL: 0.1 mg/dL — AB (ref 0.3–1.2)
CO2: 24 mmol/L (ref 22–32)
CREATININE: 0.43 mg/dL — AB (ref 0.61–1.24)
Calcium: 7.2 mg/dL — ABNORMAL LOW (ref 8.9–10.3)
Chloride: 103 mmol/L (ref 101–111)
GFR calc Af Amer: >60 mL/min (ref >60)
Glucose, Bld: 123 mg/dL — ABNORMAL HIGH (ref 65–99)
Potassium: 3.6 mmol/L (ref 3.5–5.1)
Sodium: 134 mmol/L — ABNORMAL LOW (ref 135–145)
TOTAL PROTEIN: 4 g/dL — AB (ref 6.5–8.1)

## 2015-07-31 LAB — CBC
HCT: 24.4 % — ABNORMAL LOW (ref 39.0–52.0)
HEMOGLOBIN: 8.2 g/dL — AB (ref 13.0–17.0)
MCH: 30.4 pg (ref 26.0–34.0)
MCHC: 33.6 g/dL (ref 30.0–36.0)
MCV: 90.4 fL (ref 78.0–100.0)
Platelets: 396 10*3/uL (ref 150–400)
RBC: 2.7 MIL/uL — AB (ref 4.22–5.81)
RDW: 16 % — ABNORMAL HIGH (ref 11.5–15.5)
WBC: 12.6 10*3/uL — ABNORMAL HIGH (ref 4.0–10.5)

## 2015-07-31 LAB — LIPASE, BLOOD: Lipase: 10 U/L — ABNORMAL LOW (ref 22–51)

## 2015-07-31 LAB — MAGNESIUM: Magnesium: 1.5 mg/dL — ABNORMAL LOW (ref 1.7–2.4)

## 2015-07-31 MED ORDER — SODIUM CHLORIDE 0.9 % IV SOLN
INTRAVENOUS | Status: DC
  Administered 2015-07-31: 20:00:00 via INTRAVENOUS

## 2015-07-31 MED ORDER — PEG 3350-KCL-NA BICARB-NACL 420 G PO SOLR
4000.0000 mL | Freq: Once | ORAL | Status: AC
  Administered 2015-07-31: 4000 mL via ORAL
  Filled 2015-07-31: qty 4000

## 2015-07-31 NOTE — Progress Notes (Signed)
Patient slowly drinking his prep. "It make me hurt more." Encouraged to drink more prep. Watery stool, brown.

## 2015-07-31 NOTE — Progress Notes (Signed)
[facility] PROGRESS NOTE  Rickey Martin ZOX:[id number] DOB: 02/10/1966 DOA: 07/27/2015 PCP: Tommie Raymond, MD  Assessment/Plan:  49 y/o male with PMH of COPD, Ongoing tobacco use, Anemia, pancreatitis, h/o PUD presented with epigastric abdominal pains, nausea, weight loss. Found to have severe symptomatic anemia, admitted for evaluation for anemia, abdominal pains.   1. Acute on chronic epigastric Abdominal pain -   H/o PUD. History of chronic alcoholic pancreatitis with pseudocyst - patient underwent EGD (8/19), which showed mild gastritis. Abdominal CT scan shows improving pseudocyst. Lipase is less low normal range on a consistent basis, for now continue PPI, sucralfate. appreciate GI input, who plans colonoscopy. Appears to be in no distress, continue supportive care for now.  2. Symptomatic anemia likely chronic GI blood loss. Severe IDA. ?chronic Blood loss. No s/s of active bleeding. Tfsed 2 unit on 07/28/2015. HB improved. We will transfuse as needed. Monitor closely. Started on IV iron . H&H stable.  3. COPD (chronic obstructive pulmonary disease). No wheezing on exam. Cont Duonebs PRN  4. Tobacco use disorder. Counseled to stop smoking. Nicotine Patch daily  5. Elevated leukocytosis with possible pneumonia. Placed on Levaquin and Flagyl will monitor. Echo cytosis improving. We'll stop IV fluids now as he is taking clears.   Code Status: full Family Communication: Discussed with patient and wife bedside Disposition Plan: home 1-2 days    Consultants:  GI  Procedures:  EGD - mild gastritis  CT scan abdomen and pelvis. Proven pancreatic pseudocyst  Coloscopy planned  Antibiotics:  none (indicate start date, and stop date if known)  HPI/Subjective: Alert   Objective: Filed Vitals:   07/31/15 0826  BP: 102/47  Pulse: 88  Temp: 98.1 F (36.7 C)  Resp: 18    Intake/Output Summary (Last 24 hours) at 07/31/15 1055 Last data filed at 07/31/15 0826   Gross per 24 hour  Intake 2056.67 ml  Output      0 ml  Net 2056.67 ml   Filed Weights   07/29/15 0951 07/29/15 2100 07/30/15 2043  Weight: 42.638 kg (94 lb) 43.2 kg (95 lb 3.8 oz) 47.31 kg (104 lb 4.8 oz)    Exam:   General:  No distress   Cardiovascular: s1,s2 rrr  Respiratory: CTA BL  Abdomen: soft, nt,nd   Musculoskeletal: no leg edema   Data Reviewed: Basic Metabolic Panel:  Recent Labs Lab 07/27/15 2310 07/28/15 0457 07/31/15 0529  NA 136 136 134*  K 3.9 3.9 3.6  CL 100* 105 103  CO2 22 21* 24  GLUCOSE 128* 82 123*  BUN 7 6 <5*  CREATININE 0.63 0.66 0.43*  CALCIUM 8.7* 7.8* 7.2*  MG  --   --  1.5*   Liver Function Tests:  Recent Labs Lab 07/27/15 2310 07/31/15 0529  AST 19 16  ALT 8* 7*  ALKPHOS 60 58  BILITOT 0.1* 0.1*  PROT 6.3* 4.0*  ALBUMIN 2.9* 1.7*    Recent Labs Lab 07/27/15 2310 07/29/15 0411 07/30/15 0435 07/31/15 0529  LIPASE 13* 13* 13* 10*   No results for input(s): AMMONIA in the last 168 hours. CBC:  Recent Labs Lab 07/27/15 2310 07/28/15 0457  07/29/15 0411 07/29/15 1236 07/29/15 2019 07/30/15 0435 07/31/15 0529  WBC 14.4* 11.9*  --   --   --   --  17.7* 12.6*  HGB 7.0* 6.4*  < > 8.0* 8.7* 9.2* 8.8* 8.2*  HCT 22.4* 20.3*  < > 23.5* 26.3* 27.8* 26.7* 24.4*  MCV 96.1 95.3  --   --   --   --  92.1 90.4  PLT 852* 633*  --   --   --   --  511* 396  < > = values in this interval not displayed. Cardiac Enzymes: No results for input(s): CKTOTAL, CKMB, CKMBINDEX, TROPONINI in the last 168 hours. BNP (last 3 results) No results for input(s): BNP in the last 8760 hours.  ProBNP (last 3 results) No results for input(s): PROBNP in the last 8760 hours.  CBG: No results for input(s): GLUCAP in the last 168 hours.  No results found for this or any previous visit (from the past 240 hour(s)).   Studies: Dg Chest 2 View  07/31/2015   CLINICAL DATA:  Cough and shortness of breath.  COPD.  EXAM: CHEST  2 VIEW  COMPARISON:   07/30/2015  FINDINGS: Pulmonary hyperinflation again seen, consistent with COPD. Mild worsening of diffuse heterogeneous bilateral airspace disease since prior study. Tiny pleural effusions are noted bilaterally.  IMPRESSION: Worsening diffuse heterogeneous bilateral airspace disease, superimposed on COPD. Differential diagnosis includes pulmonary edema and atypical infection.  New tiny bilateral pleural effusions.   Electronically Signed   By: Myles Rosenthal M.D.   On: 07/31/2015 09:51   Ct Abdomen Pelvis W Contrast  07/29/2015   CLINICAL DATA:  Right upper quadrant pain for several weeks. History pancreatitis. History of pseudo cysts. History of aneurysm clip in 2010 within abdomen.  EXAM: CT ABDOMEN AND PELVIS WITH CONTRAST  TECHNIQUE: Multidetector CT imaging of the abdomen and pelvis was performed using the standard protocol following bolus administration of intravenous contrast.  CONTRAST:  80mL OMNIPAQUE IOHEXOL 300 MG/ML  SOLN  COMPARISON:  05/29/2015  FINDINGS: Lower chest: Scarring at the left lung base. Mild cardiomegaly with small bilateral pleural effusions. periesophageal cystic lesion measures 2.4 x 2.9 cm and is decreased from 6.0 x 7.7 cm on the prior exam.  Hepatobiliary: Beam hardening artifact throughout the abdomen, centered in the left upper quadrant, presumably from embolization coils. Focal steatosis adjacent the falciform ligament. Gallbladder otherwise unremarkable. Normal gallbladder, without biliary ductal dilatation.  Pancreas: the pancreatic tail and upstream body are not well evaluated. This cystic lesion within the pancreatic neck/ body junction has nearly completely resolved. 6 mm on image 22 versus 1.8 cm on the prior. No convincing evidence of residual peripancreatic inflammation. No pancreatic duct dilatation.  Spleen: Normal  Adrenals/Urinary Tract: Left adrenal thickening. Normal right adrenal gland. Normal kidneys, without hydronephrosis. Normal urinary bladder.   Stomach/Bowel: Underdistended stomach. Greater curvature gastric wall thickening at 3.0 cm within the body. Scattered colonic diverticula. Sigmoid wall thickening is likely due to muscular hypertrophy. Normal terminal ileum and appendix. Normal small bowel.  Vascular/Lymphatic: Aortic and branch vessel atherosclerosis. Gastroepiploic collaterals are likely due to chronic splenic vein insufficiency. An 8 mm aortocaval node is decreased in size from 11 mm on the prior and likely reactive. No pelvic adenopathy.  Reproductive: Normal prostate.  Other: Anasarca. Increase in small volume cul-de-sac fluid, simple. Trace perihepatic ascites with ill-defined fluid or edema in the porta hepatis.  Musculoskeletal: Mild osteopenia. Remote right iliac trauma or surgery. Remote right posterior medial twelfth rib trauma. A moderate compression deformity at L1 is chronic.  IMPRESSION: 1. Degraded evaluation of the upper abdomen, especially left upper quadrant, secondary to beam hardening artifact from presumed embolization coils. 2. Decrease in size of likely communicating pseudocysts within the lower chest and pancreatic body/neck junction. 3. Gastric wall thickening, superimposed upon underdistention. Suspicious for gastritis. Gastric malignancy cannot be excluded but is felt less likely. 4.  Small volume abdominal pelvic fluid with anasarca and small bilateral pleural effusions. Question fluid overload. 5. 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   Electronically Signed   By: Jeronimo Greaves M.D.   On: 07/29/2015 17:21   Dg Chest Port 1 View  07/30/2015   CLINICAL DATA:  Cough and short of breath  EXAM: PORTABLE CHEST - 1 VIEW  COMPARISON:  07/27/2015  FINDINGS: Hazy airspace disease is present in the inferior right upper lobe and left mid lung zone. Airspace disease at the left base. Lungs are hyper aerated. Normal heart size. No pneumothorax.  IMPRESSION: Bilateral airspace disease worrisome for  bilateral pneumonia or atypical pulmonary edema. Followup PA and lateral chest X-ray is recommended in 3-4 weeks following trial of antibiotic therapy to ensure resolution and exclude underlying malignancy.   Electronically Signed   By: Jolaine Click M.D.   On: 07/30/2015 09:03    Scheduled Meds: . sodium chloride   Intravenous Once  . antiseptic oral rinse  7 mL Mouth Rinse BID  . feeding supplement  1 Container Oral TID BM  . ferric gluconate (FERRLECIT/NULECIT) IV  125 mg Intravenous Daily  . levofloxacin (LEVAQUIN) IV  750 mg Intravenous Q24H  . metronidazole  500 mg Intravenous Q8H  . nicotine  21 mg Transdermal Daily  . pantoprazole (PROTONIX) IV  40 mg Intravenous Q12H  . polyethylene glycol-electrolytes  4,000 mL Oral Once  . sodium chloride  3 mL Intravenous Q12H  . sucralfate  1 g Oral TID WC & HS   Continuous Infusions: . sodium chloride 50 mL/hr at 07/30/15 0454    Active Problems:   Anemia   Failure to thrive in adult   COPD (chronic obstructive pulmonary disease)   Tobacco use disorder   Abdominal pain   Absolute anemia   Nausea and vomiting  Time spent: >35 minutes   [facility]  [facility] Pager [phone]. If 7PM-7AM, please contact night-coverage at [email or web address], password [facility] 07/31/2015, 10:55 AM  LOS: 3 days

## 2015-08-01 ENCOUNTER — Encounter (HOSPITAL_COMMUNITY)
Admission: EM | Disposition: A | Discharge status: Home / Self Care | Payer: Self-pay | Source: Home / Self Care | Attending: Internal Medicine

## 2015-08-01 ENCOUNTER — Inpatient Hospital Stay (HOSPITAL_COMMUNITY): Payer: 59 | Admitting: Anesthesiology

## 2015-08-01 ENCOUNTER — Encounter (HOSPITAL_COMMUNITY): Payer: Self-pay | Admitting: *Deleted

## 2015-08-01 DIAGNOSIS — D500 Iron deficiency anemia secondary to blood loss (chronic): Principal | ICD-10-CM

## 2015-08-01 HISTORY — PX: COLONOSCOPY WITH PROPOFOL: SHX5780

## 2015-08-01 LAB — CBC
HCT: 23.1 % — ABNORMAL LOW (ref 39.0–52.0)
Hemoglobin: 7.7 g/dL — ABNORMAL LOW (ref 13.0–17.0)
MCH: 30.1 pg (ref 26.0–34.0)
MCHC: 33.3 g/dL (ref 30.0–36.0)
MCV: 90.2 fL (ref 78.0–100.0)
PLATELETS: 484 10*3/uL — AB (ref 150–400)
RBC: 2.56 MIL/uL — AB (ref 4.22–5.81)
RDW: 16.2 % — AB (ref 11.5–15.5)
WBC: 12.7 10*3/uL — ABNORMAL HIGH (ref 4.0–10.5)

## 2015-08-01 LAB — COMPREHENSIVE METABOLIC PANEL
ALT: 8 U/L — ABNORMAL LOW (ref 17–63)
AST: 17 U/L (ref 15–41)
Albumin: 1.7 g/dL — ABNORMAL LOW (ref 3.5–5.0)
Alkaline Phosphatase: 63 U/L (ref 38–126)
Anion gap: 8 (ref 5–15)
BUN: <5 mg/dL — ABNORMAL LOW (ref 6–20)
CHLORIDE: 102 mmol/L (ref 101–111)
CO2: 22 mmol/L (ref 22–32)
CREATININE: 0.3 mg/dL — AB (ref 0.61–1.24)
Calcium: 7.3 mg/dL — ABNORMAL LOW (ref 8.9–10.3)
Glucose, Bld: 91 mg/dL (ref 65–99)
POTASSIUM: 3 mmol/L — AB (ref 3.5–5.1)
Sodium: 132 mmol/L — ABNORMAL LOW (ref 135–145)
Total Bilirubin: 0.4 mg/dL (ref 0.3–1.2)
Total Protein: 4.3 g/dL — ABNORMAL LOW (ref 6.5–8.1)

## 2015-08-01 LAB — LIPASE, BLOOD: LIPASE: 11 U/L — AB (ref 22–51)

## 2015-08-01 SURGERY — COLONOSCOPY WITH PROPOFOL
Anesthesia: Monitor Anesthesia Care

## 2015-08-01 MED ORDER — POTASSIUM CHLORIDE CRYS ER 20 MEQ PO TBCR
40.0000 meq | EXTENDED_RELEASE_TABLET | ORAL | Status: AC
  Administered 2015-08-01 (×2): 40 meq via ORAL
  Filled 2015-08-01 (×2): qty 2

## 2015-08-01 MED ORDER — PROPOFOL INFUSION 10 MG/ML OPTIME
INTRAVENOUS | Status: DC | PRN
  Administered 2015-08-01: 50 ug/kg/min via INTRAVENOUS

## 2015-08-01 MED ORDER — LIDOCAINE HCL (CARDIAC) 20 MG/ML IV SOLN
INTRAVENOUS | Status: DC | PRN
  Administered 2015-08-01: 30 mg via INTRAVENOUS

## 2015-08-01 MED ORDER — LACTATED RINGERS IV SOLN
INTRAVENOUS | Status: DC
  Administered 2015-08-01 (×2): via INTRAVENOUS

## 2015-08-01 MED ORDER — PHENYLEPHRINE HCL 10 MG/ML IJ SOLN
INTRAMUSCULAR | Status: DC | PRN
  Administered 2015-08-01 (×3): 80 ug via INTRAVENOUS
  Administered 2015-08-01: 40 ug via INTRAVENOUS
  Administered 2015-08-01: 80 ug via INTRAVENOUS
  Administered 2015-08-01: 120 ug via INTRAVENOUS

## 2015-08-01 NOTE — Progress Notes (Signed)
Utilization review complete. Nykira Reddix RN CCM Case Mgmt phone [phone] 

## 2015-08-01 NOTE — Op Note (Signed)
[facility] [address] [place], [address]   COLONOSCOPY PROCEDURE REPORT     EXAM DATE: 08-30-2015  PATIENT NAME:      Rickey Martin, Rickey Martin           MR #:      [id number]  BIRTHDATE:       Jan 08, 1966      VISIT #:     [phone]  ATTENDING:     Dorena Cookey, MD     STATUS:     inpatient ASSISTANT:      Dwain Sarna and Oletha Blend  INDICATIONS:  The patient is a 49 yr old male here for a colonoscopy due to PROCEDURE PERFORMED: MEDICATIONS:     MAC ESTIMATED BLOOD LOSS:     None  CONSENT: The patient understands the risks and benefits of the procedure and understands that these risks include, but are not limited to: sedation, allergic reaction, infection, perforation and/or bleeding. Alternative means of evaluation and treatment include, among others: physical exam, x-rays, and/or surgical intervention. The patient elects to proceed with this endoscopic procedure.  DESCRIPTION OF PROCEDURE: During intra-op preparation period all mechanical & medical equipment was checked for proper function. Hand hygiene and appropriate measures for infection prevention was taken. After the risks, benefits and alternatives of the procedure were thoroughly explained, Informed consent was verified, confirmed and timeout was successfully executed by the treatment team. A digital exam    The Pentax Ped Colon [id number] endoscope was introduced through the anus and advanced to the cecum     . prep quality was good      The instrument was then slowly withdrawn as the colon was fully examined.Estimated blood loss is zero unless otherwise noted in this procedure report. base of the cecum was somewhat difficult to see. There was diffuse friability and what appeared to be multiple telangiectasias widely scattered throughout the cecum and proximal ascending colon. There was significant contact friability and an avulsion of the distal cecum was noted felt probably  secondary to scope trauma. Because of the diffuse friability, 2 biopsies were taken to distinguish AVMs from possible inflammation. The telangiectatic appearance faded to normal in the distal ascending colon and the remainder of the colonic mucosa appeared normal all the way to the rectum with no further telangiectasias polyps inflammation or diverticuli.  : The scope was then completely withdrawn from the patient and the procedure terminated. SCOPE WITHDRAWAL TIME:    ADVERSE EVENTS:      There were no immediate complications.  IMPRESSIONS:     apparent diffuse telangiectasias of the cecum and ascending colon, etiology is clear no prior known history of radiation Biopsies taken confirm vascular versus inflammatory histology   RECOMMENDATIONS:     await biopsies. Advance diet. Monitor stools and hemoglobin RECALL:  _____________________________ Dorena Cookey, MD eSigned:  Dorena Cookey, MD 2015-08-30 9:50 AM   cc:   CPT CODES: ICD CODES:  The ICD and CPT codes recommended by this software are interpretations from the data that the clinical staff has captured with the software.  The verification of the translation of this report to the ICD and CPT codes and modifiers is the sole responsibility of the health care institution and practicing physician where this report was generated.  [employer] will not be held responsible for the validity of the ICD and CPT codes included on this report.  AMA assumes no liability for data contained or not contained herein. CPT is  a Publishing rights manager of the [employer].   PATIENT NAME:  Rickey, Martin MR#: [id number]

## 2015-08-01 NOTE — Transfer of Care (Signed)
Immediate Anesthesia Transfer of Care Note  Patient: Rickey Martin  Procedure(s) Performed: Procedure(s): COLONOSCOPY WITH PROPOFOL (N/A)  Patient Location: PACU  Anesthesia Type:MAC  Level of Consciousness: awake, patient cooperative and responds to stimulation  Airway & Oxygen Therapy: Patient Spontanous Breathing and Patient connected to face mask oxygen  Post-op Assessment: Report given to RN, Post -op Vital signs reviewed and stable and Patient moving all extremities X 4  Post vital signs: Reviewed and stable  Last Vitals:  Filed Vitals:   08/01/15 0815  BP: 100/66  Pulse: 75  Temp: 37 C  Resp: 12    Complications: No apparent anesthesia complications

## 2015-08-01 NOTE — Progress Notes (Signed)
[facility] PROGRESS NOTE  Rickey Martin EXB:[id number] DOB: September 30, 1966 DOA: 07/27/2015 PCP: Tommie Raymond, MD  Assessment/Plan:  49 y/o male with PMH of COPD, Ongoing tobacco use, Anemia, pancreatitis, h/o PUD presented with epigastric abdominal pains, nausea, weight loss. Found to have severe symptomatic anemia, admitted for evaluation for anemia, abdominal pains.   1. Acute on chronic epigastric Abdominal pain -   H/o PUD. History of chronic alcoholic pancreatitis with pseudocyst - patient underwent EGD (8/19), which showed mild gastritis. Abdominal CT scan shows improving pseudocyst. Lipase is less low normal range on a consistent basis, for now continue PPI, sucralfate. appreciate GI input, who plans colonoscopy. Appears to be in no distress, continue supportive care for now.  2. Symptomatic Iron deficiency anemia likely due to chronic GI blood loss. Severe IDA. ?chronic Blood loss. No s/s of active bleeding. Transfused 2 unit on 07/28/2015. HB improved. We will transfuse as needed. Monitor closely. Started on IV iron . H&H stable.  3. COPD (chronic obstructive pulmonary disease). No wheezing on exam. Cont Duonebs PRN  4. Tobacco use disorder. Counseled to stop smoking. Nicotine Patch daily  5. Elevated leukocytosis with possible aspiration pneumonia (CAP). Placed on Levaquin and Flagyl will monitor. Echo cytosis improving. We'll stop IV fluids now as he is taking clears.  6.Hypokalemia - replaced, monitor.    Code Status: full Family Communication: Discussed with patient and wife bedside Disposition Plan: home 1-2 days    Consultants:  GI  Procedures:  EGD - mild gastritis  CT scan abdomen and pelvis. Proven pancreatic pseudocyst  Coloscopy planned  Antibiotics:  none (indicate start date, and stop date if known)  HPI/Subjective: Alert   Objective: Filed Vitals:   08/01/15 0815  BP: 100/66  Pulse: 75  Temp: 98.6 F (37 C)  Resp: 12     Intake/Output Summary (Last 24 hours) at 08/01/15 0944 Last data filed at 08/01/15 0943  Gross per 24 hour  Intake 2242.67 ml  Output      0 ml  Net 2242.67 ml   Filed Weights   07/29/15 2100 07/30/15 2043 07/31/15 2100  Weight: 43.2 kg (95 lb 3.8 oz) 47.31 kg (104 lb 4.8 oz) 46.6 kg (102 lb 11.8 oz)    Exam:   General:  No distress   Cardiovascular: s1,s2 rrr  Respiratory: CTA BL  Abdomen: soft, nt,nd   Musculoskeletal: no leg edema   Data Reviewed: Basic Metabolic Panel:  Recent Labs Lab 07/27/15 2310 07/28/15 0457 07/31/15 0529 08/01/15 0502  NA 136 136 134* 132*  K 3.9 3.9 3.6 3.0*  CL 100* 105 103 102  CO2 22 21* 24 22  GLUCOSE 128* 82 123* 91  BUN 7 6 <5* <5*  CREATININE 0.63 0.66 0.43* 0.30*  CALCIUM 8.7* 7.8* 7.2* 7.3*  MG  --   --  1.5*  --    Liver Function Tests:  Recent Labs Lab 07/27/15 2310 07/31/15 0529 08/01/15 0502  AST 19 16 17   ALT 8* 7* 8*  ALKPHOS 60 58 63  BILITOT 0.1* 0.1* 0.4  PROT 6.3* 4.0* 4.3*  ALBUMIN 2.9* 1.7* 1.7*    Recent Labs Lab 07/27/15 2310 07/29/15 0411 07/30/15 0435 07/31/15 0529 08/01/15 0502  LIPASE 13* 13* 13* 10* 11*   No results for input(s): AMMONIA in the last 168 hours. CBC:  Recent Labs Lab 07/27/15 2310 07/28/15 0457  07/29/15 1236 07/29/15 2019 07/30/15 0435 07/31/15 0529 08/01/15 0502  WBC 14.4* 11.9*  --   --   --  17.7* 12.6* 12.7*  HGB 7.0* 6.4*  < > 8.7* 9.2* 8.8* 8.2* 7.7*  HCT 22.4* 20.3*  < > 26.3* 27.8* 26.7* 24.4* 23.1*  MCV 96.1 95.3  --   --   --  92.1 90.4 90.2  PLT 852* 633*  --   --   --  511* 396 484*  < > = values in this interval not displayed. Cardiac Enzymes: No results for input(s): CKTOTAL, CKMB, CKMBINDEX, TROPONINI in the last 168 hours. BNP (last 3 results) No results for input(s): BNP in the last 8760 hours.  ProBNP (last 3 results) No results for input(s): PROBNP in the last 8760 hours.  CBG: No results for input(s): GLUCAP in the last 168  hours.  No results found for this or any previous visit (from the past 240 hour(s)).   Studies: Dg Chest 2 View  07/31/2015   CLINICAL DATA:  Cough and shortness of breath.  COPD.  EXAM: CHEST  2 VIEW  COMPARISON:  07/30/2015  FINDINGS: Pulmonary hyperinflation again seen, consistent with COPD. Mild worsening of diffuse heterogeneous bilateral airspace disease since prior study. Tiny pleural effusions are noted bilaterally.  IMPRESSION: Worsening diffuse heterogeneous bilateral airspace disease, superimposed on COPD. Differential diagnosis includes pulmonary edema and atypical infection.  New tiny bilateral pleural effusions.   Electronically Signed   By: Myles Rosenthal M.D.   On: 07/31/2015 09:51    Scheduled Meds: . antiseptic oral rinse  7 mL Mouth Rinse BID  . feeding supplement  1 Container Oral TID BM  . ferric gluconate (FERRLECIT/NULECIT) IV  125 mg Intravenous Daily  . levofloxacin (LEVAQUIN) IV  750 mg Intravenous Q24H  . metronidazole  500 mg Intravenous Q8H  . nicotine  21 mg Transdermal Daily  . pantoprazole (PROTONIX) IV  40 mg Intravenous Q12H  . sodium chloride  3 mL Intravenous Q12H  . sucralfate  1 g Oral TID WC & HS   Continuous Infusions: . sodium chloride 20 mL/hr at 07/31/15 1952  . lactated ringers      Active Problems:   Anemia   Failure to thrive in adult   COPD (chronic obstructive pulmonary disease)   Tobacco use disorder   Abdominal pain   Absolute anemia   Nausea and vomiting  Time spent: >35 minutes   [facility] K  [facility] Pager [phone]. If 7PM-7AM, please contact night-coverage at [email or web address], password [facility] 08/01/2015, 9:44 AM  LOS: 4 days

## 2015-08-01 NOTE — Anesthesia Postprocedure Evaluation (Signed)
  Anesthesia Post-op Note  Patient: Rickey Martin  Procedure(s) Performed: Procedure(s): COLONOSCOPY WITH PROPOFOL (N/A)  Patient Location: PACU  Anesthesia Type:MAC  Level of Consciousness: awake, alert  and oriented  Airway and Oxygen Therapy: Patient Spontanous Breathing  Post-op Pain: none  Post-op Assessment: Post-op Vital signs reviewed              Post-op Vital Signs: Reviewed  Last Vitals:  Filed Vitals:   08/01/15 1030  BP: 116/60  Pulse: 77  Temp: 36.9 C  Resp: 15    Complications: No apparent anesthesia complications

## 2015-08-01 NOTE — Anesthesia Preprocedure Evaluation (Signed)
Anesthesia Evaluation  Patient identified by MRN, date of birth, ID band Patient awake    Reviewed: Allergy & Precautions, NPO status , Patient's Chart, lab work & pertinent test results  Airway Mallampati: II  TM Distance: >3 FB Neck ROM: Full    Dental no notable dental hx. (+) Poor Dentition   Pulmonary COPDCurrent Smoker,  breath sounds clear to auscultation  Pulmonary exam normal       Cardiovascular + Peripheral Vascular Disease negative cardio ROS Normal cardiovascular exam+ Valvular Problems/Murmurs Rhythm:Regular Rate:Normal     Neuro/Psych PSYCHIATRIC DISORDERS negative neurological ROS     GI/Hepatic Neg liver ROS, PUD, GERD-  ,  Endo/Other  negative endocrine ROS  Renal/GU negative Renal ROS     Musculoskeletal negative musculoskeletal ROS (+)   Abdominal   Peds  Hematology negative hematology ROS (+) anemia ,   Anesthesia Other Findings   Reproductive/Obstetrics negative OB ROS                             Anesthesia Physical  Anesthesia Plan  ASA: III  Anesthesia Plan: MAC   Post-op Pain Management:    Induction: Intravenous  Airway Management Planned: Simple Face Mask and Natural Airway  Additional Equipment:   Intra-op Plan:   Post-operative Plan:   Informed Consent: I have reviewed the patients History and Physical, chart, labs and discussed the procedure including the risks, benefits and alternatives for the proposed anesthesia with the patient or authorized representative who has indicated his/her understanding and acceptance.   Dental advisory given  Plan Discussed with: CRNA  Anesthesia Plan Comments:         Anesthesia Quick Evaluation

## 2015-08-02 ENCOUNTER — Encounter (HOSPITAL_COMMUNITY): Payer: Self-pay | Admitting: Gastroenterology

## 2015-08-02 LAB — CBC
HCT: 24.6 % — ABNORMAL LOW (ref 39.0–52.0)
Hemoglobin: 8.1 g/dL — ABNORMAL LOW (ref 13.0–17.0)
MCH: 29.6 pg (ref 26.0–34.0)
MCHC: 32.9 g/dL (ref 30.0–36.0)
MCV: 89.8 fL (ref 78.0–100.0)
PLATELETS: 501 10*3/uL — AB (ref 150–400)
RBC: 2.74 MIL/uL — AB (ref 4.22–5.81)
RDW: 16.1 % — ABNORMAL HIGH (ref 11.5–15.5)
WBC: 13.1 10*3/uL — ABNORMAL HIGH (ref 4.0–10.5)

## 2015-08-02 LAB — MAGNESIUM: Magnesium: 1.5 mg/dL — ABNORMAL LOW (ref 1.7–2.4)

## 2015-08-02 LAB — POTASSIUM: Potassium: 3.5 mmol/L (ref 3.5–5.1)

## 2015-08-02 MED ORDER — POTASSIUM CHLORIDE CRYS ER 20 MEQ PO TBCR
40.0000 meq | EXTENDED_RELEASE_TABLET | ORAL | Status: DC
  Administered 2015-08-02: 40 meq via ORAL
  Filled 2015-08-02: qty 2

## 2015-08-02 MED ORDER — ONDANSETRON HCL 4 MG PO TABS: 4.0000 mg | ORAL_TABLET | Freq: Three times a day (TID) | ORAL | Status: AC | PRN

## 2015-08-02 MED ORDER — AMOXICILLIN-POT CLAVULANATE 875-125 MG PO TABS: 1.0000 | ORAL_TABLET | Freq: Two times a day (BID) | ORAL | Status: DC

## 2015-08-02 MED ORDER — FERROUS SULFATE 325 (65 FE) MG PO TABS: 325.0000 mg | ORAL_TABLET | Freq: Two times a day (BID) | ORAL | Status: AC

## 2015-08-02 MED ORDER — MAGNESIUM SULFATE 2 GM/50ML IV SOLN
2.0000 g | Freq: Once | INTRAVENOUS | Status: AC
  Administered 2015-08-02: 2 g via INTRAVENOUS
  Filled 2015-08-02: qty 50

## 2015-08-02 MED ORDER — PANTOPRAZOLE SODIUM 40 MG PO TBEC: 40.0000 mg | DELAYED_RELEASE_TABLET | Freq: Every day | ORAL | Status: DC

## 2015-08-02 MED ORDER — NICOTINE 21 MG/24HR TD PT24: 21.0000 mg | MEDICATED_PATCH | Freq: Every day | TRANSDERMAL | Status: DC
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
## 2015-08-02 NOTE — Discharge Instructions (Signed)
Follow with Primary MD Roxanne Mins, PA-C in 7 days   Get CBC, CMP, 2 view Chest X ray checked  by Primary MD next visit.    Activity: As tolerated with Full fall precautions use walker/cane & assistance as needed   Disposition Home     Diet: Heart Healthy Low Fat.  For Heart failure patients - Check your Weight same time everyday, if you gain over 2 pounds, or you develop in leg swelling, experience more shortness of breath or chest pain, call your Primary MD immediately. Follow Cardiac Low Salt Diet and 1.5 lit/day fluid restriction.   On your next visit with your primary care physician please Get Medicines reviewed and adjusted.   Please request your Prim.MD to go over all Hospital Tests and Procedure/Radiological results at the follow up, please get all Hospital records sent to your Prim MD by signing hospital release before you go home.   If you experience worsening of your admission symptoms, develop shortness of breath, life threatening emergency, suicidal or homicidal thoughts you must seek medical attention immediately by calling 911 or calling your MD immediately  if symptoms less severe.  You Must read complete instructions/literature along with all the possible adverse reactions/side effects for all the Medicines you take and that have been prescribed to you. Take any new Medicines after you have completely understood and accpet all the possible adverse reactions/side effects.   Do not drive, operating heavy machinery, perform activities at heights, swimming or participation in water activities or provide baby sitting services if your were admitted for syncope or siezures until you have seen by Primary MD or a Neurologist and advised to do so again.  Do not drive when taking Pain medications.    Do not take more than prescribed Pain, Sleep and Anxiety Medications  Special Instructions: If you have smoked or chewed Tobacco  in the last 2 yrs please stop smoking, stop any  regular Alcohol  and or any Recreational drug use.  Wear Seat belts while driving.   Please note  You were cared for by a hospitalist during your hospital stay. If you have any questions about your discharge medications or the care you received while you were in the hospital after you are discharged, you can call the unit and asked to speak with the hospitalist on call if the hospitalist that took care of you is not available. Once you are discharged, your primary care physician will handle any further medical issues. Please note that NO REFILLS for any discharge medications will be authorized once you are discharged, as it is imperative that you return to your primary care physician (or establish a relationship with a primary care physician if you do not have one) for your aftercare needs so that they can reassess your need for medications and monitor your lab values.                                                      Rickey Martin was admitted to the Hospital on 07/27/2015 and Discharged  08/02/2015 and should be excused from work/school   for 10  days starting 07/27/2015 , may return to work/school without any restrictions.  Call Susa Raring MD, [facility]  [phone] with questions.  [name] M.D on 08/02/2015,at 7:50 AM  [facility]   Office  [phone] ° °

## 2015-08-02 NOTE — Progress Notes (Signed)
Patient discharge teaching given, including activity, diet, follow-up appoints, and medications. Patient verbalized understanding of all discharge instructions. IV access was d/c'd. Vitals are stable. Skin is intact except as charted in most recent assessments. Pt to be escorted out by NT, to be driven home by family.  Shakeia Krus, MBA, BS, RN 

## 2015-08-02 NOTE — Progress Notes (Signed)
Hb stable. Colon path pending. Will f/u tomorrow

## 2015-10-22 ENCOUNTER — Encounter (HOSPITAL_BASED_OUTPATIENT_CLINIC_OR_DEPARTMENT_OTHER): Payer: Self-pay | Admitting: *Deleted

## 2015-10-22 ENCOUNTER — Emergency Department (HOSPITAL_BASED_OUTPATIENT_CLINIC_OR_DEPARTMENT_OTHER)
Admission: EM | Admit: 2015-10-22 | Discharge: 2015-10-23 | Disposition: A | Discharge status: Home / Self Care | Payer: 59 | Attending: Emergency Medicine | Admitting: Emergency Medicine

## 2015-10-22 DIAGNOSIS — R636 Underweight: Secondary | ICD-10-CM | POA: Diagnosis not present

## 2015-10-22 DIAGNOSIS — Z8679 Personal history of other diseases of the circulatory system: Secondary | ICD-10-CM | POA: Diagnosis not present

## 2015-10-22 DIAGNOSIS — Z8711 Personal history of peptic ulcer disease: Secondary | ICD-10-CM | POA: Diagnosis not present

## 2015-10-22 DIAGNOSIS — D649 Anemia, unspecified: Secondary | ICD-10-CM | POA: Diagnosis not present

## 2015-10-22 DIAGNOSIS — Z7951 Long term (current) use of inhaled steroids: Secondary | ICD-10-CM | POA: Diagnosis not present

## 2015-10-22 DIAGNOSIS — R197 Diarrhea, unspecified: Secondary | ICD-10-CM | POA: Insufficient documentation

## 2015-10-22 DIAGNOSIS — J449 Chronic obstructive pulmonary disease, unspecified: Secondary | ICD-10-CM | POA: Insufficient documentation

## 2015-10-22 DIAGNOSIS — R1013 Epigastric pain: Secondary | ICD-10-CM

## 2015-10-22 DIAGNOSIS — Z9889 Other specified postprocedural states: Secondary | ICD-10-CM | POA: Insufficient documentation

## 2015-10-22 DIAGNOSIS — K219 Gastro-esophageal reflux disease without esophagitis: Secondary | ICD-10-CM | POA: Insufficient documentation

## 2015-10-22 DIAGNOSIS — F17200 Nicotine dependence, unspecified, uncomplicated: Secondary | ICD-10-CM | POA: Insufficient documentation

## 2015-10-22 DIAGNOSIS — Z79899 Other long term (current) drug therapy: Secondary | ICD-10-CM | POA: Diagnosis not present

## 2015-10-22 DIAGNOSIS — K920 Hematemesis: Secondary | ICD-10-CM | POA: Diagnosis present

## 2015-10-22 DIAGNOSIS — R112 Nausea with vomiting, unspecified: Secondary | ICD-10-CM | POA: Diagnosis not present

## 2015-10-22 LAB — CBC WITH DIFFERENTIAL/PLATELET
BASOS ABS: 0.1 10*3/uL (ref 0.0–0.1)
BASOS PCT: 0 %
EOS ABS: 0 10*3/uL (ref 0.0–0.7)
EOS PCT: 0 %
HCT: 42.9 % (ref 39.0–52.0)
HEMOGLOBIN: 14.2 g/dL (ref 13.0–17.0)
LYMPHS ABS: 3.4 10*3/uL (ref 0.7–4.0)
Lymphocytes Relative: 21 %
MCH: 30.4 pg (ref 26.0–34.0)
MCHC: 33.1 g/dL (ref 30.0–36.0)
MCV: 91.9 fL (ref 78.0–100.0)
Monocytes Absolute: 1.2 10*3/uL — ABNORMAL HIGH (ref 0.1–1.0)
Monocytes Relative: 7 %
NEUTROS PCT: 72 %
Neutro Abs: 11.7 10*3/uL — ABNORMAL HIGH (ref 1.7–7.7)
PLATELETS: 569 10*3/uL — AB (ref 150–400)
RBC: 4.67 MIL/uL (ref 4.22–5.81)
RDW: 18.8 % — ABNORMAL HIGH (ref 11.5–15.5)
WBC: 16.4 10*3/uL — AB (ref 4.0–10.5)

## 2015-10-22 LAB — COMPREHENSIVE METABOLIC PANEL
ALBUMIN: 3.9 g/dL (ref 3.5–5.0)
ALK PHOS: 77 U/L (ref 38–126)
ALT: 9 U/L — AB (ref 17–63)
AST: 15 U/L (ref 15–41)
Anion gap: 21 — ABNORMAL HIGH (ref 5–15)
BUN: 14 mg/dL (ref 6–20)
CALCIUM: 8.9 mg/dL (ref 8.9–10.3)
CHLORIDE: 97 mmol/L — AB (ref 101–111)
CO2: 18 mmol/L — AB (ref 22–32)
CREATININE: 0.92 mg/dL (ref 0.61–1.24)
GFR calc non Af Amer: >60 mL/min (ref >60)
GLUCOSE: 94 mg/dL (ref 65–99)
Potassium: 3.7 mmol/L (ref 3.5–5.1)
SODIUM: 136 mmol/L (ref 135–145)
Total Bilirubin: 1 mg/dL (ref 0.3–1.2)
Total Protein: 7.6 g/dL (ref 6.5–8.1)

## 2015-10-22 LAB — LIPASE, BLOOD: Lipase: 19 U/L (ref 11–51)

## 2015-10-22 LAB — PROTIME-INR
INR: 1.56 — ABNORMAL HIGH (ref 0.00–1.49)
PROTHROMBIN TIME: 18.7 s — AB (ref 11.6–15.2)

## 2015-10-22 LAB — OCCULT BLOOD X 1 CARD TO LAB, STOOL: FECAL OCCULT BLD: POSITIVE — AB

## 2015-10-22 MED ORDER — SODIUM CHLORIDE 0.9 % IV SOLN
1000.0000 mL | INTRAVENOUS | Status: DC
  Administered 2015-10-22: 1000 mL via INTRAVENOUS

## 2015-10-22 MED ORDER — OMEPRAZOLE 20 MG PO CPDR: 20.0000 mg | DELAYED_RELEASE_CAPSULE | Freq: Two times a day (BID) | ORAL | Status: DC

## 2015-10-22 MED ORDER — SODIUM CHLORIDE 0.9 % IV SOLN
1000.0000 mL | Freq: Once | INTRAVENOUS | Status: AC
Start: 2015-10-22 — End: 2015-10-22
  Administered 2015-10-22: 1000 mL via INTRAVENOUS

## 2015-10-22 MED ORDER — HYDROMORPHONE HCL 1 MG/ML IJ SOLN
1.0000 mg | INTRAMUSCULAR | Status: AC | PRN
  Administered 2015-10-22 (×2): 1 mg via INTRAVENOUS
  Filled 2015-10-22 (×2): qty 1

## 2015-10-22 MED ORDER — PROMETHAZINE HCL 25 MG PO TABS: 25.0000 mg | ORAL_TABLET | Freq: Four times a day (QID) | ORAL | Status: DC | PRN

## 2015-10-22 MED ORDER — ONDANSETRON HCL 4 MG/2ML IJ SOLN
4.0000 mg | Freq: Once | INTRAMUSCULAR | Status: AC
  Administered 2015-10-22: 4 mg via INTRAVENOUS
  Filled 2015-10-22: qty 2

## 2015-10-22 NOTE — ED Provider Notes (Signed)
CSN: [id number]   Arrival date & time 10/22/15 [id number]  By signing my name below, I, Bethel Born, attest that this documentation has been prepared under the direction and in the presence of Linwood Dibbles, MD. Electronically Signed: Bethel Born, ED Scribe. 10/22/2015. 10:01 PM.  Chief Complaint  Patient presents with  . GI Bleeding    HPI The history is provided by the patient and medical records. No language interpreter was used.   Rickey Martin is a 49 y.o. male with history of anemia requiring transfusions, gastric pepticulcer and pancreatitis who presents to the Emergency Department complaining of hematemesis with onset today. He has had 3 episodes of emesis. Associated symptoms include diffuse abdominal pain, dark diarrhea, and chills. No fever or frank blood noted in his stool. He was last transfused 4 months ago.  Last used alcohol 3 days ago.    Past Medical History  Diagnosis Date  . Pancreatitis   . AAA (abdominal aortic aneurysm) (HCC)   . Gastric peptic ulcer   . COPD (chronic obstructive pulmonary disease) (HCC)   . Blood transfusion without reported diagnosis   . Heart valve disorder "leaky heart valve"  . History of blood transfusion     "w/stomach ulcer and today" (07/28/2015)  . Anemia   . GERD (gastroesophageal reflux disease)     Past Surgical History  Procedure Laterality Date  . Abdominal aortic aneurysm repair  ~ 2009 X 2  . Abdominal aortic aneurysm repair      "put a stent in"  . Splenic artery embolization  12/2009    [name] 01/26/2010  . Esophagogastroduodenoscopy (egd) with propofol N/A 07/29/2015    Procedure: ESOPHAGOGASTRODUODENOSCOPY (EGD) WITH PROPOFOL;  Surgeon: Charlott Rakes, MD;  Location: [facility] ENDOSCOPY;  Service: Endoscopy;  Laterality: N/A;  . Colonoscopy with propofol N/A 08/01/2015    Procedure: COLONOSCOPY WITH PROPOFOL;  Surgeon: Dorena Cookey, MD;  Location: [facility] ENDOSCOPY;  Service: Endoscopy;  Laterality: N/A;    History  reviewed. No pertinent family history.  Social History  Substance Use Topics  . Smoking status: Current Every Day Smoker -- 1.00 packs/day for 36 years  . Smokeless tobacco: Never Used  . Alcohol Use: Yes     Comment: 07/28/2015 "I drink a very little; not even enough to count"     Review of Systems  Constitutional: Positive for chills. Negative for fever.  Gastrointestinal: Positive for nausea, vomiting, abdominal pain and diarrhea. Negative for blood in stool.  All other systems reviewed and are negative.  Home Medications   Prior to Admission medications   Medication Sig Start Date End Date Taking? Authorizing Provider  albuterol (PROVENTIL HFA;VENTOLIN HFA) 108 (90 BASE) MCG/ACT inhaler Inhale 2 puffs into the lungs every 6 (six) hours as needed for wheezing or shortness of breath.    Historical Provider, MD  amoxicillin-clavulanate (AUGMENTIN) 875-125 MG per tablet Take 1 tablet by mouth 2 (two) times daily. 08/02/15   [name], MD  DULoxetine (CYMBALTA) 30 MG capsule Take 30 mg by mouth daily.    Historical Provider, MD  ferrous sulfate 325 (65 FE) MG tablet Take 1 tablet (325 mg total) by mouth 2 (two) times daily with a meal. 08/02/15   [name], MD  Fluticasone Furoate-Vilanterol (BREO ELLIPTA) 100-25 MCG/INH AEPB Inhale 1 puff into the lungs daily.     Historical Provider, MD  levothyroxine (SYNTHROID, LEVOTHROID) 50 MCG tablet Take 50 mcg by mouth daily before breakfast.    Historical Provider, MD  nicotine (NICODERM CQ - DOSED IN MG/24 HOURS) 21 mg/24hr patch Place 1 patch (21 mg total) onto the skin daily. 08/02/15   Leroy SeaPrashant K Singh, MD  omeprazole (PRILOSEC) 20 MG capsule Take 1 capsule (20 mg total) by mouth 2 (two) times daily before a meal. 10/22/15   Linwood DibblesJon Sahvanna Mcmanigal, MD  ondansetron (ZOFRAN) 4 MG tablet Take 1 tablet (4 mg total) by mouth every 8 (eight) hours as needed for nausea or vomiting. 08/02/15   Leroy SeaPrashant K Singh, MD  oxyCODONE-acetaminophen (PERCOCET)  10-325 MG per tablet Take 1 tablet by mouth every 6 (six) hours as needed for pain.    Historical Provider, MD  pantoprazole (PROTONIX) 40 MG tablet Take 1 tablet (40 mg total) by mouth daily. 08/02/15   Leroy SeaPrashant K Singh, MD  promethazine (PHENERGAN) 25 MG tablet Take 1 tablet (25 mg total) by mouth every 6 (six) hours as needed for nausea or vomiting. 10/22/15   Linwood DibblesJon Kydan Shanholtzer, MD  traZODone (DESYREL) 50 MG tablet Take 50 mg by mouth at bedtime.    Historical Provider, MD  Vitamin D, Ergocalciferol, (DRISDOL) 50000 UNITS CAPS capsule Take 50,000 Units by mouth every Sunday.    Historical Provider, MD    Allergies  Review of patient's allergies indicates no known allergies.  Triage Vitals: BP 136/83 mmHg  Pulse 92  Temp(Src) 97.9 F (36.6 C) (Oral)  Resp 20  Ht 5\' 9"  (1.753 m)  Wt 110 lb (49.896 kg)  BMI 16.24 kg/m2  SpO2 100%  Physical Exam  Constitutional: No distress.  Thin, underweight  HENT:  Head: Normocephalic and atraumatic.  Right Ear: External ear normal.  Left Ear: External ear normal.  Eyes: Conjunctivae are normal. Right eye exhibits no discharge. Left eye exhibits no discharge. No scleral icterus.  Neck: Neck supple. No tracheal deviation present.  Cardiovascular: Normal rate, regular rhythm and intact distal pulses.   Pulmonary/Chest: Effort normal and breath sounds normal. No stridor. No respiratory distress. He has no wheezes. He has no rales.  Abdominal: Soft. Bowel sounds are normal. He exhibits no distension and no mass. There is tenderness in the epigastric area. There is no rigidity, no rebound and no guarding.  Genitourinary: Rectal exam shows no mass and no tenderness.  Brown stool   Musculoskeletal: He exhibits no edema or tenderness.  Neurological: He is alert. He has normal strength. No cranial nerve deficit (no facial droop, extraocular movements intact, no slurred speech) or sensory deficit. He exhibits normal muscle tone. He displays no seizure activity.  Coordination normal.  Skin: Skin is warm and dry. No rash noted.  Psychiatric: He has a normal mood and affect.  Nursing note and vitals reviewed.   ED Course  Procedures   DIAGNOSTIC STUDIES: Oxygen Saturation is 100% on RA, normal by my interpretation.    COORDINATION OF CARE: 9:58 PM Discussed treatment plan which includes lab work with pt at bedside and pt agreed to plan. Old records including GI notes from previous hospitalizations reviewed.   Meds Given Medications  0.9 %  sodium chloride infusion (1,000 mLs Intravenous New Bag/Given 10/22/15 2227)    Followed by  0.9 %  sodium chloride infusion (1,000 mLs Intravenous New Bag/Given 10/22/15 2311)  HYDROmorphone (DILAUDID) injection 1 mg (1 mg Intravenous Given 10/22/15 2227)  ondansetron (ZOFRAN) injection 4 mg (4 mg Intravenous Given 10/22/15 2227)     Labs Reviewed  CBC WITH DIFFERENTIAL/PLATELET - Abnormal; Notable for the following:    WBC 16.4 (*)    RDW  18.8 (*)    Platelets 569 (*)    Neutro Abs 11.7 (*)    Monocytes Absolute 1.2 (*)    All other components within normal limits  COMPREHENSIVE METABOLIC PANEL - Abnormal; Notable for the following:    Chloride 97 (*)    CO2 18 (*)    ALT 9 (*)    Anion gap 21 (*)    All other components within normal limits  OCCULT BLOOD X 1 CARD TO LAB, STOOL - Abnormal; Notable for the following:    Fecal Occult Bld POSITIVE (*)    All other components within normal limits  PROTIME-INR - Abnormal; Notable for the following:    Prothrombin Time 18.7 (*)    INR 1.56 (*)    All other components within normal limits  LIPASE, BLOOD  URINALYSIS, ROUTINE W REFLEX MICROSCOPIC (NOT AT [facility])  POC OCCULT BLOOD, ED    I personally reviewed and evaluated these lab results as a part of my medical decision-making.   MDM   Final diagnoses:  Epigastric pain  Non-intractable vomiting with nausea, vomiting of unspecified type   Pt's symptoms improved with treatment.  Stools were  guaic positive but no melena and hgb is stable.  Sx may be related to his prior gastritis or could be related to his chronic pancreatitis issues.  He has been drinking alcohol recently.  Will dc home with antinausea medications. Restart his PPI.  Liquid diet, bowel rest, avoid alcohol.  Follow up with PCP to be rechecked.  I personally performed the services described in this documentation, which was scribed in my presence.  The recorded information has been reviewed and is accurate.   Linwood Dibbles, MD 10/22/15 [phone]

## 2015-10-22 NOTE — ED Notes (Addendum)
C/o abd pain and nv. vomitng blood, describes as dark and coffee ground like. Pain onset 2-3d ago. Vomiting onset 0300. h/o pancreatitis and ulcers. Pt has a hemotologist. H/o transfusions (last 4 months ago). Also admits to a little sob and light headedness. Last ETOH 3d ago. Took tylenol and goodies powder PTA. Last ate 3d ago. Last BM 1300. Mentions "some diarrhea this am, but that has resolved". Relates sob to COPD.

## 2015-10-22 NOTE — Discharge Instructions (Signed)

## 2017-04-12 ENCOUNTER — Emergency Department (HOSPITAL_COMMUNITY)
Admission: EM | Admit: 2017-04-12 | Discharge: 2017-04-12 | Disposition: A | Discharge status: Home / Self Care | Payer: No Typology Code available for payment source | Attending: Emergency Medicine | Admitting: Emergency Medicine

## 2017-04-12 ENCOUNTER — Emergency Department (HOSPITAL_COMMUNITY): Payer: No Typology Code available for payment source

## 2017-04-12 ENCOUNTER — Encounter (HOSPITAL_COMMUNITY): Payer: Self-pay

## 2017-04-12 DIAGNOSIS — F17200 Nicotine dependence, unspecified, uncomplicated: Secondary | ICD-10-CM | POA: Diagnosis not present

## 2017-04-12 DIAGNOSIS — Y92410 Unspecified street and highway as the place of occurrence of the external cause: Secondary | ICD-10-CM | POA: Diagnosis not present

## 2017-04-12 DIAGNOSIS — Y999 Unspecified external cause status: Secondary | ICD-10-CM | POA: Diagnosis not present

## 2017-04-12 DIAGNOSIS — J449 Chronic obstructive pulmonary disease, unspecified: Secondary | ICD-10-CM | POA: Insufficient documentation

## 2017-04-12 DIAGNOSIS — M79641 Pain in right hand: Secondary | ICD-10-CM | POA: Diagnosis not present

## 2017-04-12 DIAGNOSIS — S6991XA Unspecified injury of right wrist, hand and finger(s), initial encounter: Secondary | ICD-10-CM | POA: Diagnosis present

## 2017-04-12 DIAGNOSIS — Y939 Activity, unspecified: Secondary | ICD-10-CM | POA: Diagnosis not present

## 2017-04-12 MED ORDER — OXYCODONE HCL 5 MG PO TABS
10.0000 mg | ORAL_TABLET | Freq: Once | ORAL | Status: AC
  Administered 2017-04-12: 10 mg via ORAL
  Filled 2017-04-12: qty 2

## 2017-04-12 MED ORDER — HYDROMORPHONE HCL 1 MG/ML IJ SOLN
1.0000 mg | Freq: Once | INTRAMUSCULAR | Status: AC
  Administered 2017-04-12: 1 mg via INTRAVENOUS
  Filled 2017-04-12: qty 1

## 2017-04-12 NOTE — ED Notes (Signed)
Taken to Xray.

## 2017-04-12 NOTE — ED Notes (Signed)
Patient soaking hand
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
## 2017-04-12 NOTE — ED Notes (Signed)
Fingers wrapped and care of fingers reviewed and they verbalize understanding. Leaving with girlfriend today

## 2017-04-12 NOTE — ED Notes (Signed)
Taken to X-ray  Iodine solution prepared for when patient gets back
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
## 2017-04-17 ENCOUNTER — Encounter (HOSPITAL_COMMUNITY): Payer: Self-pay | Admitting: Emergency Medicine

## 2017-04-17 ENCOUNTER — Emergency Department (HOSPITAL_COMMUNITY)
Admission: EM | Admit: 2017-04-17 | Discharge: 2017-04-17 | Disposition: A | Discharge status: Home / Self Care | Payer: No Typology Code available for payment source | Attending: Dermatology | Admitting: Dermatology

## 2017-04-17 DIAGNOSIS — Y939 Activity, unspecified: Secondary | ICD-10-CM | POA: Insufficient documentation

## 2017-04-17 DIAGNOSIS — Y999 Unspecified external cause status: Secondary | ICD-10-CM | POA: Insufficient documentation

## 2017-04-17 DIAGNOSIS — Y92410 Unspecified street and highway as the place of occurrence of the external cause: Secondary | ICD-10-CM | POA: Diagnosis not present

## 2017-04-17 DIAGNOSIS — S6991XA Unspecified injury of right wrist, hand and finger(s), initial encounter: Secondary | ICD-10-CM | POA: Diagnosis not present

## 2017-04-17 DIAGNOSIS — R0789 Other chest pain: Secondary | ICD-10-CM | POA: Insufficient documentation

## 2017-04-17 NOTE — ED Notes (Signed)
Pt states that he is unable to wait any longer and will be leaving.
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
## 2017-04-18 ENCOUNTER — Encounter (HOSPITAL_COMMUNITY): Payer: Self-pay | Admitting: Emergency Medicine

## 2017-04-18 ENCOUNTER — Ambulatory Visit (HOSPITAL_COMMUNITY)
Admission: EM | Admit: 2017-04-18 | Discharge: 2017-04-18 | Disposition: A | Discharge status: Home / Self Care | Payer: 59 | Attending: Internal Medicine | Admitting: Internal Medicine

## 2017-04-18 DIAGNOSIS — L03113 Cellulitis of right upper limb: Secondary | ICD-10-CM

## 2017-04-18 DIAGNOSIS — M79641 Pain in right hand: Secondary | ICD-10-CM

## 2017-04-18 MED ORDER — DOXYCYCLINE HYCLATE 100 MG PO CAPS: 100.0000 mg | ORAL_CAPSULE | Freq: Two times a day (BID) | ORAL | 0 refills | Status: DC
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
## 2017-04-18 NOTE — Discharge Instructions (Signed)
I have discussed her case with the on-call hand specialist, Dr. Merlyn LotKuzma, and he has agreed to take take you as a patient. I have provided his contact information, contact his office tomorrow morning to schedule an appointment for follow-up care. I started on doxycycline, take one tablet twice a day, keep your hand clean, dry, change your dressings at least daily. Continue the oxycodone as prescribed for pain management.

## 2017-04-28 ENCOUNTER — Encounter (HOSPITAL_COMMUNITY): Payer: Self-pay | Admitting: Emergency Medicine

## 2017-04-28 ENCOUNTER — Inpatient Hospital Stay (HOSPITAL_COMMUNITY)
Admission: EM | Admit: 2017-04-28 | Discharge: 2017-04-30 | DRG: 812 | Disposition: A | Discharge status: Home / Self Care | Payer: BLUE CROSS/BLUE SHIELD | Attending: Internal Medicine | Admitting: Internal Medicine

## 2017-04-28 ENCOUNTER — Emergency Department (HOSPITAL_COMMUNITY): Payer: BLUE CROSS/BLUE SHIELD

## 2017-04-28 DIAGNOSIS — Z8711 Personal history of peptic ulcer disease: Secondary | ICD-10-CM

## 2017-04-28 DIAGNOSIS — K449 Diaphragmatic hernia without obstruction or gangrene: Secondary | ICD-10-CM | POA: Diagnosis present

## 2017-04-28 DIAGNOSIS — G8929 Other chronic pain: Secondary | ICD-10-CM | POA: Diagnosis present

## 2017-04-28 DIAGNOSIS — D649 Anemia, unspecified: Secondary | ICD-10-CM | POA: Diagnosis present

## 2017-04-28 DIAGNOSIS — K210 Gastro-esophageal reflux disease with esophagitis: Secondary | ICD-10-CM | POA: Diagnosis present

## 2017-04-28 DIAGNOSIS — R600 Localized edema: Secondary | ICD-10-CM | POA: Diagnosis present

## 2017-04-28 DIAGNOSIS — D509 Iron deficiency anemia, unspecified: Principal | ICD-10-CM | POA: Diagnosis present

## 2017-04-28 DIAGNOSIS — E119 Type 2 diabetes mellitus without complications: Secondary | ICD-10-CM | POA: Diagnosis present

## 2017-04-28 DIAGNOSIS — Z7951 Long term (current) use of inhaled steroids: Secondary | ICD-10-CM | POA: Diagnosis not present

## 2017-04-28 DIAGNOSIS — I959 Hypotension, unspecified: Secondary | ICD-10-CM | POA: Diagnosis present

## 2017-04-28 DIAGNOSIS — R609 Edema, unspecified: Secondary | ICD-10-CM

## 2017-04-28 DIAGNOSIS — I459 Conduction disorder, unspecified: Secondary | ICD-10-CM | POA: Diagnosis present

## 2017-04-28 DIAGNOSIS — Z8679 Personal history of other diseases of the circulatory system: Secondary | ICD-10-CM

## 2017-04-28 DIAGNOSIS — M549 Dorsalgia, unspecified: Secondary | ICD-10-CM | POA: Diagnosis present

## 2017-04-28 DIAGNOSIS — M1990 Unspecified osteoarthritis, unspecified site: Secondary | ICD-10-CM | POA: Diagnosis present

## 2017-04-28 DIAGNOSIS — R001 Bradycardia, unspecified: Secondary | ICD-10-CM | POA: Diagnosis present

## 2017-04-28 DIAGNOSIS — I5083 High output heart failure: Secondary | ICD-10-CM | POA: Diagnosis present

## 2017-04-28 DIAGNOSIS — F1099 Alcohol use, unspecified with unspecified alcohol-induced disorder: Secondary | ICD-10-CM | POA: Diagnosis not present

## 2017-04-28 DIAGNOSIS — G894 Chronic pain syndrome: Secondary | ICD-10-CM

## 2017-04-28 DIAGNOSIS — K921 Melena: Secondary | ICD-10-CM | POA: Diagnosis present

## 2017-04-28 DIAGNOSIS — I342 Nonrheumatic mitral (valve) stenosis: Secondary | ICD-10-CM | POA: Diagnosis not present

## 2017-04-28 DIAGNOSIS — D62 Acute posthemorrhagic anemia: Secondary | ICD-10-CM | POA: Diagnosis present

## 2017-04-28 DIAGNOSIS — J449 Chronic obstructive pulmonary disease, unspecified: Secondary | ICD-10-CM | POA: Diagnosis present

## 2017-04-28 DIAGNOSIS — F17210 Nicotine dependence, cigarettes, uncomplicated: Secondary | ICD-10-CM | POA: Diagnosis present

## 2017-04-28 DIAGNOSIS — F1010 Alcohol abuse, uncomplicated: Secondary | ICD-10-CM | POA: Diagnosis present

## 2017-04-28 HISTORY — DX: Type 2 diabetes mellitus without complications: E11.9

## 2017-04-28 LAB — CBC
HCT: 19.1 % — ABNORMAL LOW (ref 39.0–52.0)
Hemoglobin: 6 g/dL — CL (ref 13.0–17.0)
MCH: 26.2 pg (ref 26.0–34.0)
MCHC: 31.4 g/dL (ref 30.0–36.0)
MCV: 83.4 fL (ref 78.0–100.0)
PLATELETS: 693 10*3/uL — AB (ref 150–400)
RBC: 2.29 MIL/uL — AB (ref 4.22–5.81)
RDW: 17.7 % — ABNORMAL HIGH (ref 11.5–15.5)
WBC: 12 10*3/uL — AB (ref 4.0–10.5)

## 2017-04-28 LAB — IRON AND TIBC
IRON: 5 ug/dL — AB (ref 45–182)
Saturation Ratios: 1 % — ABNORMAL LOW (ref 17.9–39.5)
TIBC: 342 ug/dL (ref 250–450)
UIBC: 337 ug/dL

## 2017-04-28 LAB — COMPREHENSIVE METABOLIC PANEL
ALT: 8 U/L — AB (ref 17–63)
AST: 16 U/L (ref 15–41)
Albumin: 2.4 g/dL — ABNORMAL LOW (ref 3.5–5.0)
Alkaline Phosphatase: 94 U/L (ref 38–126)
Anion gap: 12 (ref 5–15)
BILIRUBIN TOTAL: 0.1 mg/dL — AB (ref 0.3–1.2)
BUN: 7 mg/dL (ref 6–20)
CALCIUM: 7.7 mg/dL — AB (ref 8.9–10.3)
CO2: 22 mmol/L (ref 22–32)
CREATININE: 0.7 mg/dL (ref 0.61–1.24)
Chloride: 97 mmol/L — ABNORMAL LOW (ref 101–111)
Glucose, Bld: 115 mg/dL — ABNORMAL HIGH (ref 65–99)
Potassium: 3.8 mmol/L (ref 3.5–5.1)
Sodium: 131 mmol/L — ABNORMAL LOW (ref 135–145)
Total Protein: 5.7 g/dL — ABNORMAL LOW (ref 6.5–8.1)

## 2017-04-28 LAB — RETICULOCYTES
RBC.: 2.21 MIL/uL — AB (ref 4.22–5.81)
RETIC COUNT ABSOLUTE: 68.5 10*3/uL (ref 19.0–186.0)
Retic Ct Pct: 3.1 % (ref 0.4–3.1)

## 2017-04-28 LAB — VITAMIN B12: VITAMIN B 12: 922 pg/mL — AB (ref 180–914)

## 2017-04-28 LAB — I-STAT TROPONIN, ED: Troponin i, poc: 0.01 ng/mL (ref 0.00–0.08)

## 2017-04-28 LAB — POC OCCULT BLOOD, ED: FECAL OCCULT BLD: NEGATIVE

## 2017-04-28 LAB — PREPARE RBC (CROSSMATCH)

## 2017-04-28 LAB — FOLATE: FOLATE: 13.5 ng/mL (ref >5.9)

## 2017-04-28 LAB — FERRITIN: Ferritin: 7 ng/mL — ABNORMAL LOW (ref 24–336)

## 2017-04-28 MED ORDER — PANTOPRAZOLE SODIUM 40 MG IV SOLR
40.0000 mg | Freq: Two times a day (BID) | INTRAVENOUS | Status: DC
  Administered 2017-04-28 – 2017-04-30 (×4): 40 mg via INTRAVENOUS
  Filled 2017-04-28 (×4): qty 40

## 2017-04-28 MED ORDER — OXYCODONE-ACETAMINOPHEN 10-325 MG PO TABS: 1.0000 | ORAL_TABLET | Freq: Four times a day (QID) | ORAL | Status: DC | PRN

## 2017-04-28 MED ORDER — FLUTICASONE FUROATE-VILANTEROL 100-25 MCG/INH IN AEPB: 1.0000 | INHALATION_SPRAY | Freq: Every day | RESPIRATORY_TRACT | Status: DC

## 2017-04-28 MED ORDER — SODIUM CHLORIDE 0.9 % IV SOLN
10.0000 mL/h | Freq: Once | INTRAVENOUS | Status: AC
  Administered 2017-04-28: 10 mL/h via INTRAVENOUS

## 2017-04-28 MED ORDER — FERROUS SULFATE 325 (65 FE) MG PO TABS
325.0000 mg | ORAL_TABLET | Freq: Two times a day (BID) | ORAL | Status: DC
  Administered 2017-04-29 – 2017-04-30 (×2): 325 mg via ORAL
  Filled 2017-04-28 (×2): qty 1

## 2017-04-28 MED ORDER — NICOTINE 21 MG/24HR TD PT24: 21.0000 mg | MEDICATED_PATCH | Freq: Every day | TRANSDERMAL | Status: DC

## 2017-04-28 MED ORDER — OXYCODONE HCL 5 MG PO TABS
5.0000 mg | ORAL_TABLET | Freq: Four times a day (QID) | ORAL | Status: DC | PRN
  Administered 2017-04-29 – 2017-04-30 (×4): 5 mg via ORAL
  Filled 2017-04-28 (×4): qty 1

## 2017-04-28 MED ORDER — VITAMIN D (ERGOCALCIFEROL) 1.25 MG (50000 UNIT) PO CAPS
50000.0000 [IU] | ORAL_CAPSULE | ORAL | Status: DC
  Administered 2017-04-29: 50000 [IU] via ORAL
  Filled 2017-04-28: qty 1

## 2017-04-28 MED ORDER — LEVOTHYROXINE SODIUM 50 MCG PO TABS
50.0000 ug | ORAL_TABLET | Freq: Every day | ORAL | Status: DC
  Administered 2017-04-29 – 2017-04-30 (×2): 50 ug via ORAL
  Filled 2017-04-28 (×2): qty 1

## 2017-04-28 MED ORDER — FLUTICASONE FUROATE-VILANTEROL 100-25 MCG/INH IN AEPB
1.0000 | INHALATION_SPRAY | Freq: Every day | RESPIRATORY_TRACT | Status: DC
  Administered 2017-04-29 – 2017-04-30 (×2): 1 via RESPIRATORY_TRACT
  Filled 2017-04-28 (×2): qty 28

## 2017-04-28 MED ORDER — NICOTINE 21 MG/24HR TD PT24
21.0000 mg | MEDICATED_PATCH | Freq: Every day | TRANSDERMAL | Status: DC
  Administered 2017-04-28 – 2017-04-30 (×3): 21 mg via TRANSDERMAL
  Filled 2017-04-28 (×3): qty 1

## 2017-04-28 MED ORDER — OXYCODONE-ACETAMINOPHEN 5-325 MG PO TABS
1.0000 | ORAL_TABLET | Freq: Four times a day (QID) | ORAL | Status: DC | PRN
  Administered 2017-04-29 – 2017-04-30 (×4): 1 via ORAL
  Filled 2017-04-28 (×4): qty 1

## 2017-04-28 MED ORDER — OXYCODONE HCL 5 MG PO TABS
10.0000 mg | ORAL_TABLET | Freq: Once | ORAL | Status: AC
  Administered 2017-04-28: 10 mg via ORAL
  Filled 2017-04-28: qty 2

## 2017-04-28 MED ORDER — TRAZODONE HCL 50 MG PO TABS
50.0000 mg | ORAL_TABLET | Freq: Every day | ORAL | Status: DC
  Administered 2017-04-28 – 2017-04-29 (×2): 50 mg via ORAL
  Filled 2017-04-28 (×2): qty 1

## 2017-04-28 MED ORDER — ALBUTEROL SULFATE (2.5 MG/3ML) 0.083% IN NEBU
2.5000 mg | INHALATION_SOLUTION | Freq: Four times a day (QID) | RESPIRATORY_TRACT | Status: DC | PRN
Start: 2017-04-28 — End: 2017-04-30

## 2017-04-28 MED ORDER — PANTOPRAZOLE SODIUM 40 MG IV SOLR: 40.0000 mg | Freq: Once | INTRAVENOUS | Status: DC

## 2017-04-28 MED ORDER — OXYCODONE HCL 5 MG PO TABS: 10.0000 mg | ORAL_TABLET | Freq: Once | ORAL | Status: DC

## 2017-04-28 NOTE — ED Notes (Signed)
First type and screen labeled wrong, lab rejected. 2nd type and screen drawn and resent to lab.

## 2017-04-28 NOTE — ED Triage Notes (Signed)
[facility] c[facility] and that he needed an emergent blood transfusion. Pt denies blood in stool or N/V.

## 2017-04-28 NOTE — H&P (Signed)
History and Physical   Rickey Martin:[id number] DOB: 02-12-1966 DOA: 04/28/2017  PCP: Rickey Rud, PA-C  Chief Complaint: Anemia  HPI:  [name] is a 51 year old Caucasian male past medical history significant for AAA, COPD, GERD, gastric peptic ulcer with history of upper GI bleed, alcohol-induced pancreatitis. Patient presents to the emergency department on 5/20 for evaluation of abnormal labs. He was recently seen at outside hospital where he presented for lower extremity edema. Patient was found to have a hemoglobin level of 6.0 and was transferred to [facility] for further evaluation. Patient states he has had anemia in the past and a definitive diagnosis has not been established. He reports having an upper GI bleed in 2010 that required 7 units blood transfusion. He was diagnosed with a peptic ulcer by EGD at that time. In the interim, patient underwent EGD that showed antral gastritis in 2016 and also colonoscopy that showed diffuse telangiectasia in August 2016. Patient denies any recent bleeding. No coughing or spitting up blood. No dark-colored stool. No frank blood per rectum. Patient states he is chronically fatigued. He drinks approximately 2-3 beers per night. No recent change in medication. No sick contacts. No weight loss. Patient denies any chest pain, but endorses shortness of breath at baseline, which she attributes to his COPD. In terms of the lower shin edema, patient also reports this is a chronic problem. He reports his edema is currently better than baseline.  ED Course:  Patient was hemodynamically stable in the emergency department. He was afebrile, heart rate 73, respiratory rate 22, and initial blood pressure 118/51. Rectal exam negative for blood. Labs notable for hemoglobin 6.0. Chest x-ray shows small left pleural effusion, and questionable density in the right apex. Patient was given 2 units packed red blood cells and also intravenous pantoprazole.  Review of  Systems: A complete ROS was obtained; pertinent positives negatives are denoted in the HPI. Otherwise, all systems are negative.   Past Medical History:  Diagnosis Date  . AAA (abdominal aortic aneurysm) (HCC)   . Anemia   . Blood transfusion without reported diagnosis   . COPD (chronic obstructive pulmonary disease) (HCC)   . Gastric peptic ulcer   . GERD (gastroesophageal reflux disease)   . Heart valve disorder "leaky heart valve"  . History of blood transfusion    "w/stomach ulcer and today" (07/28/2015)  . Pancreatitis    Social History   Social History  . Marital status: Divorced    Spouse name: N/A  . Number of children: N/A  . Years of education: N/A   Occupational History  . Not on file.   Social History Main Topics  . Smoking status: Current Every Day Smoker    Packs/day: 1.00    Years: 36.00  . Smokeless tobacco: Never Used  . Alcohol use Yes     Comment: 07/28/2015 "I drink a very little; not even enough to count"  . Drug use: No  . Sexual activity: No   Other Topics Concern  . Not on file   Social History Narrative  . No narrative on file   Family History: No gastrointestinal malignancies. However, patient is not particularly close to his family does not know their medical history in any great detail.   Physical Exam: Vitals:   04/28/17 2030 04/28/17 2043 04/28/17 2045 04/28/17 2100  BP: 108/72  (!) 115/48 (!) 110/55  Pulse: 72 70 75 68  Resp:  16    Temp:  98.2 F (36.8  C)    TempSrc:  Oral    SpO2: 95% 98% 98% 95%  Weight:      Height:       General: Appears calm and comfortable. Appears pale.  ENT: Grossly normal hearing, MMM. Conjunctival pallor. Cardiovascular: RRR. No M/R/G. 2+ pitting edema of the lower extremity from ankle to knee. Respiratory: CTA bilaterally. No wheezes or crackles. Normal respiratory effort. Abdomen: Soft, non-tender. Bowel sounds present.  Skin: Dry skin on plantar aspect of both feet with scaling and mild  erythema. Musculoskeletal: Grossly normal tone BUE/BLE. Appropriate ROM.  Psychiatric: Grossly normal mood and affect. Neurologic: Moves all extremities in coordinated fashion.  I have personally reviewed the following labs, culture data, and imaging studies.  Labs:  White blood cell count 12.0, hemoglobin 6.0, MCV 83. BUNs 7, creatinine 0.7. Iron 5, ferritin 7.  Radiology:  CXR (5/20) Small left pleural effusion new from the prior exam.  Questionable density in the right apex. Given the patient's history and persistent pain CT of the chest may be helpful to rule out occult bony injury as well as evaluate the right apex.  Assessment/Plan: [name] is a 51 year old Caucasian male past medical history significant for AAA, COPD, GERD, gastric peptic ulcer with history of upper GI bleed, alcohol-induced pancreatitis. Patient presents to the emergency department on 5/20 for evaluation of abnormal labs.  #1 Suspected acute blood loss anemia Patient is currently hemodynamically stable and presented with a hemoglobin level of 6. I suspect this is a slow and somewhat chronic blood loss. Patient's normal heart rate and normal BUN/creatinine ratio also suggest this is a chronic problem. Patient has a history of peptic ulcer disease, and this is the leading diagnosis at this time. However, the telangiectasia noted on colonoscopy in 2016 also raises concern for more systemic illness. No obvious signs of inflammatory bowel disease. Patient has history of AAA that was repaired in 2009. No abdominal pain or distention to suggest active bleeding. Doubt underlying infection. B12 level checked in emergency department was normal. Plan is to admit patient to inpatient setting for blood transfusion and further monitoring. Will make NPO at midnight for EGD in the morning. Will need gastroenterology consult at that time. Will trend CBC every 6 hours after 2 unit blood transfusion. Will empirically treat with  intravenous pantoprazole every 12 hours. Repeat fluid bolus for hypotension.  #2 Lower extremity edema This is a chronic problem for the patient. Unclear etiology at this time. Hypothyroidism, heart failure, and nephrotic syndrome are on the differential diagnosis. The small left pleural effusion is slightly concerning for congestive heart failure. Will not diurese at this time given acute blood loss anemia area and will check BNP and transthoracic echo. Will also check protein creatinine ratio, and urinalysis to evaluate renal function.  #3 GERD/peptic ulcer disease This is been diagnosed by EGD in the past. No abdominal pain currently. Will treat with intravenous PPI as noted above.  #4 Tobacco abuse and alcohol use Patient has been treated in the past for alcohol-induced pancreatitis. No signs or symptoms of withdrawal. Will continue home nicotine patch for tobacco abuse. Will monitor for alcohol withdrawal. Will also check B1 level  DVT prophylaxis: Subq Lovenox Code Status: Full Code Disposition Plan: Anticipate D/C home in 2-5 days Consults called: N/A; wil need GI consult in the morning of 5/21 Admission status: Inpatient admission for EGD  Tyrone SageMatthew Adekunle Rohrbach, MD [facility] [phone]  If 7PM-7AM, please contact night-coverage [email or web address] Password [email or web address]
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
## 2017-04-28 NOTE — ED Notes (Signed)
Called lab and blood bank to check on Type and Screen; neither can find type and screen. Phleb redrawing type and screen.

## 2017-04-28 NOTE — ED Notes (Signed)
Patient transported to X-ray 

## 2017-04-29 ENCOUNTER — Inpatient Hospital Stay (HOSPITAL_COMMUNITY): Payer: BLUE CROSS/BLUE SHIELD | Admitting: Certified Registered"

## 2017-04-29 ENCOUNTER — Encounter (HOSPITAL_COMMUNITY)
Admission: EM | Disposition: A | Discharge status: Home / Self Care | Payer: Self-pay | Source: Home / Self Care | Attending: Internal Medicine

## 2017-04-29 ENCOUNTER — Encounter (HOSPITAL_COMMUNITY): Payer: Self-pay

## 2017-04-29 ENCOUNTER — Inpatient Hospital Stay (HOSPITAL_COMMUNITY): Payer: BLUE CROSS/BLUE SHIELD

## 2017-04-29 DIAGNOSIS — F1099 Alcohol use, unspecified with unspecified alcohol-induced disorder: Secondary | ICD-10-CM

## 2017-04-29 DIAGNOSIS — J449 Chronic obstructive pulmonary disease, unspecified: Secondary | ICD-10-CM

## 2017-04-29 DIAGNOSIS — I342 Nonrheumatic mitral (valve) stenosis: Secondary | ICD-10-CM

## 2017-04-29 DIAGNOSIS — IMO0002 Reserved for concepts with insufficient information to code with codable children: Secondary | ICD-10-CM | POA: Insufficient documentation

## 2017-04-29 DIAGNOSIS — D509 Iron deficiency anemia, unspecified: Secondary | ICD-10-CM | POA: Diagnosis present

## 2017-04-29 DIAGNOSIS — G8929 Other chronic pain: Secondary | ICD-10-CM | POA: Diagnosis present

## 2017-04-29 DIAGNOSIS — M549 Dorsalgia, unspecified: Secondary | ICD-10-CM

## 2017-04-29 HISTORY — PX: ESOPHAGOGASTRODUODENOSCOPY (EGD) WITH PROPOFOL: SHX5813

## 2017-04-29 LAB — CBC
HCT: 33.6 % — ABNORMAL LOW (ref 39.0–52.0)
HEMATOCRIT: 27.8 % — AB (ref 39.0–52.0)
HEMOGLOBIN: 8.9 g/dL — AB (ref 13.0–17.0)
HEMOGLOBIN: 10.7 g/dL — AB (ref 13.0–17.0)
MCH: 26.7 pg (ref 26.0–34.0)
MCH: 26.8 pg (ref 26.0–34.0)
MCHC: 32 g/dL (ref 30.0–36.0)
MCHC: 31.8 g/dL (ref 30.0–36.0)
MCV: 83.5 fL (ref 78.0–100.0)
MCV: 84.2 fL (ref 78.0–100.0)
Platelets: 606 10*3/uL — ABNORMAL HIGH (ref 150–400)
Platelets: 734 10*3/uL — ABNORMAL HIGH (ref 150–400)
RBC: 3.33 MIL/uL — AB (ref 4.22–5.81)
RBC: 3.99 MIL/uL — AB (ref 4.22–5.81)
RDW: 16.2 % — ABNORMAL HIGH (ref 11.5–15.5)
RDW: 16.7 % — ABNORMAL HIGH (ref 11.5–15.5)
WBC: 10.8 10*3/uL — AB (ref 4.0–10.5)
WBC: 11.3 10*3/uL — ABNORMAL HIGH (ref 4.0–10.5)

## 2017-04-29 LAB — RAPID URINE DRUG SCREEN, HOSP PERFORMED
AMPHETAMINES: NOT DETECTED
BENZODIAZEPINES: NOT DETECTED
Barbiturates: NOT DETECTED
Cocaine: NOT DETECTED
OPIATES: POSITIVE — AB
TETRAHYDROCANNABINOL: NOT DETECTED

## 2017-04-29 LAB — URINALYSIS, ROUTINE W REFLEX MICROSCOPIC
Bilirubin Urine: NEGATIVE
Glucose, UA: NEGATIVE mg/dL
Hgb urine dipstick: NEGATIVE
Ketones, ur: NEGATIVE mg/dL
Leukocytes, UA: NEGATIVE
Nitrite: NEGATIVE
Protein, ur: NEGATIVE mg/dL
Specific Gravity, Urine: 1.004 — ABNORMAL LOW (ref 1.005–1.030)
pH: 5 (ref 5.0–8.0)

## 2017-04-29 LAB — PROTEIN / CREATININE RATIO, URINE
Creatinine, Urine: 13.3 mg/dL
Total Protein, Urine: <6 mg/dL

## 2017-04-29 LAB — HIV ANTIBODY (ROUTINE TESTING W REFLEX): HIV Screen 4th Generation wRfx: NONREACTIVE

## 2017-04-29 LAB — BRAIN NATRIURETIC PEPTIDE: B Natriuretic Peptide: 170.6 pg/mL — ABNORMAL HIGH (ref 0.0–100.0)

## 2017-04-29 LAB — TSH: TSH: 2.545 u[IU]/mL (ref 0.350–4.500)

## 2017-04-29 SURGERY — ESOPHAGOGASTRODUODENOSCOPY (EGD) WITH PROPOFOL
Anesthesia: Monitor Anesthesia Care

## 2017-04-29 MED ORDER — GLYCOPYRROLATE 0.2 MG/ML IJ SOLN
INTRAMUSCULAR | Status: DC | PRN
  Administered 2017-04-29: 0.4 mg via INTRAVENOUS

## 2017-04-29 MED ORDER — SODIUM CHLORIDE 0.9 % IV SOLN
25.0000 mg | Freq: Once | INTRAVENOUS | Status: DC
  Filled 2017-04-29: qty 0.5

## 2017-04-29 MED ORDER — PROPOFOL 10 MG/ML IV BOLUS
INTRAVENOUS | Status: DC | PRN
  Administered 2017-04-29: 10 mg via INTRAVENOUS

## 2017-04-29 MED ORDER — SODIUM CHLORIDE 0.9 % IV SOLN: INTRAVENOUS | Status: DC

## 2017-04-29 MED ORDER — POTASSIUM CHLORIDE CRYS ER 20 MEQ PO TBCR
40.0000 meq | EXTENDED_RELEASE_TABLET | Freq: Two times a day (BID) | ORAL | Status: AC
  Administered 2017-04-29 (×2): 40 meq via ORAL
  Filled 2017-04-29 (×2): qty 2

## 2017-04-29 MED ORDER — VITAMIN B-1 100 MG PO TABS
100.0000 mg | ORAL_TABLET | Freq: Every day | ORAL | Status: DC
  Administered 2017-04-29 – 2017-04-30 (×2): 100 mg via ORAL
  Filled 2017-04-29 (×2): qty 1

## 2017-04-29 MED ORDER — FUROSEMIDE 10 MG/ML IJ SOLN
40.0000 mg | Freq: Two times a day (BID) | INTRAMUSCULAR | Status: AC
  Administered 2017-04-29 (×2): 40 mg via INTRAVENOUS
  Filled 2017-04-29 (×2): qty 4

## 2017-04-29 MED ORDER — SODIUM CHLORIDE 0.9 % IV SOLN
510.0000 mg | Freq: Once | INTRAVENOUS | Status: AC
  Administered 2017-04-29: 510 mg via INTRAVENOUS
  Filled 2017-04-29: qty 17

## 2017-04-29 MED ORDER — LACTATED RINGERS IV SOLN
INTRAVENOUS | Status: DC
  Administered 2017-04-29: 13:00:00 via INTRAVENOUS

## 2017-04-29 MED ORDER — LACTATED RINGERS IV SOLN
INTRAVENOUS | Status: DC | PRN
  Administered 2017-04-29: 14:00:00 via INTRAVENOUS

## 2017-04-29 MED ORDER — SODIUM CHLORIDE 0.9 % IV SOLN
250.0000 mg | Freq: Once | INTRAVENOUS | Status: DC
  Filled 2017-04-29: qty 5

## 2017-04-29 MED ORDER — PROPOFOL 500 MG/50ML IV EMUL
INTRAVENOUS | Status: DC | PRN
  Administered 2017-04-29: 100 ug/kg/min via INTRAVENOUS

## 2017-04-29 SURGICAL SUPPLY — 14 items

## 2017-04-29 NOTE — Brief Op Note (Addendum)
04/28/2017 - 04/29/2017  2:18 PM  PATIENT:  Rickey Martin  51 y.o. male  PRE-OPERATIVE DIAGNOSIS:  Severe IDA  POST-OPERATIVE DIAGNOSIS:  esophagitis/gastritis  PROCEDURE:  Procedure(s): ESOPHAGOGASTRODUODENOSCOPY (EGD) WITH PROPOFOL (N/A)  SURGEON:  Surgeon(s) and Role:    * Ashaad Gaertner, MD - Primary  Findings/recommendations ----------------------------------- - EGD was aborted prior to advancing scope into D2  because of the cardiovascular complication (bradycardia and possible drop of QRS complexes.) - It showed LA grade D esophagitis as well as gastritis. No active bleeding. - Recommend twice a day PPI for 8 weeks. - Recommend repeat EGD in 8 weeks to document healing of esophagitis. - Need for colonoscopy to be decided as an outpatient basis. No plan for inpatient endoscopic workup at this point. - Discuss with Dr. Jomarie LongsJoseph. Recommend cardiology evaluation prior to discharge. - Full liquid diet for now.   Kathi DerParag Kestrel Mis MD, FACP 04/29/2017, 2:23 PM  Pager [phone]  If no answer or after 5 PM call [phone]

## 2017-04-29 NOTE — Progress Notes (Signed)
  Echocardiogram 2D Echocardiogram has been performed.  Janalyn HarderWest, Hardin Hardenbrook R 04/29/2017, 2:33 PM

## 2017-04-29 NOTE — Transfer of Care (Signed)
Immediate Anesthesia Transfer of Care Note  Patient: Rickey Martin  Procedure(s) Performed: Procedure(s): ESOPHAGOGASTRODUODENOSCOPY (EGD) WITH PROPOFOL (N/A)  Patient Location: Endoscopy Unit  Anesthesia Type:MAC  Level of Consciousness: awake, alert  and oriented  Airway & Oxygen Therapy: Patient Spontanous Breathing and Patient connected to nasal cannula oxygen  Post-op Assessment: Report given to RN, Post -op Vital signs reviewed and stable and Patient moving all extremities X 4  Post vital signs: Reviewed and stable  Last Vitals:  Vitals:   04/29/17 0916 04/29/17 1309  BP: (!) 113/53 125/65  Pulse: 63 (!) 58  Resp: 17 16  Temp: 36.9 C 36.6 C    Last Pain:  Vitals:   04/29/17 1309  TempSrc: Oral  PainSc: 8       Patients Stated Pain Goal: 0 (34/28/76 8115)  Complications: No apparent anesthesia complications

## 2017-04-29 NOTE — Anesthesia Postprocedure Evaluation (Signed)
Anesthesia Post Note  Patient: Rickey Martin  Procedure(s) Performed: Procedure(s) (LRB): ESOPHAGOGASTRODUODENOSCOPY (EGD) WITH PROPOFOL (N/A)  Patient location during evaluation: PACU Anesthesia Type: MAC Level of consciousness: awake and alert Pain management: pain level controlled Vital Signs Assessment: post-procedure vital signs reviewed and stable Respiratory status: spontaneous breathing, nonlabored ventilation, respiratory function stable and patient connected to nasal cannula oxygen Cardiovascular status: stable and blood pressure returned to baseline Anesthetic complications: no Comments: Procedure aborted due to cardiac arrythmias. GI MD ordered cardiology consult. Pt stable post op.       Last Vitals:  Vitals:   04/29/17 1424 04/29/17 1434  BP: 121/68 120/60  Pulse: 71 68  Resp: 15 15  Temp:      Last Pain:  Vitals:   04/29/17 1414  TempSrc: Oral  PainSc: 8                  Winnifred Dufford DAVID

## 2017-04-29 NOTE — Progress Notes (Signed)
New Admission Note:  Arrival Method: By Bed from the ED, around 2230 Mental Orientation: Alert and oriented Telemetry: Box 19, CCMD notified Assessment: Completed Skin: Completed, refer to flowsheets IV: Right forearm and right AC  Pain: 9/10, chronic, meds given Tubes: None Safety Measures: Safety Fall Prevention Plan was given, discussed and signed. Admission: Completed [address] Orientation: Patient has been orientated to the room, unit and the staff. Family: None  Orders have been reviewed and implemented. Will continue to monitor the patient. Call light has been placed within reach and bed alarm has been activated.   Alfonse Rashristy Gillis Boardley, RN  Phone Number: [phone]
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
## 2017-04-29 NOTE — Anesthesia Preprocedure Evaluation (Addendum)
Anesthesia Evaluation  Patient identified by MRN, date of birth, ID band Patient awake    Reviewed: Allergy & Precautions, NPO status , Patient's Chart, lab work & pertinent test results  Airway Mallampati: I  TM Distance: >3 FB Neck ROM: Full    Dental   Pulmonary COPD,  COPD inhaler, Current Smoker,    Pulmonary exam normal        Cardiovascular Normal cardiovascular exam     Neuro/Psych    GI/Hepatic GERD  Medicated and Controlled,  Endo/Other  diabetes, Type 2, Oral Hypoglycemic Agents  Renal/GU      Musculoskeletal   Abdominal   Peds  Hematology   Anesthesia Other Findings   Reproductive/Obstetrics                            Anesthesia Physical Anesthesia Plan  ASA: II  Anesthesia Plan: MAC   Post-op Pain Management:    Induction: Intravenous  Airway Management Planned: Simple Face Mask  Additional Equipment:   Intra-op Plan:   Post-operative Plan:   Informed Consent: I have reviewed the patients History and Physical, chart, labs and discussed the procedure including the risks, benefits and alternatives for the proposed anesthesia with the patient or authorized representative who has indicated his/her understanding and acceptance.     Plan Discussed with: CRNA and Surgeon  Anesthesia Plan Comments:         Anesthesia Quick Evaluation

## 2017-04-29 NOTE — Op Note (Signed)
[facility] Name: Rickey [facility] Procedure Date : 04/29/2017 MRN: [id number] Attending MD: Kathi DerParag Yalda Herd , MD Date of Birth: 12/13/1965 CSN: [id number] Age: 51 Admit Type: Inpatient Procedure:                Upper GI endoscopy Indications:              Suspected upper gastrointestinal bleeding in                            patient with unexplained iron deficiency anemia,                            Suspected upper gastrointestinal bleeding in                            patient with chronic blood loss Providers:                Kathi DerParag Antavia Tandy, MD, Dwain SarnaPatricia Ford, RN, Harrington ChallengerHope                            Parker, Technician, Margo AyeValeria McKoy, Technician,                            Bettey MareKathryn M. Print production plannerBaker CRNA, CRNA Referring MD:              Medicines:                Sedation Administered by an Anesthesia Professional Complications:            Bradycardia, required no intervention Estimated Blood Loss:     Estimated blood loss: none. Procedure:                Pre-Anesthesia Assessment:                           - Prior to the procedure, a History and Physical                            was performed, and patient medications and                            allergies were reviewed. The patient's tolerance of                            previous anesthesia was also reviewed. The risks                            and benefits of the procedure and the sedation                            options and risks were discussed with the patient.                            All questions were answered, and informed consent  was obtained. Prior Anticoagulants: The patient has                            taken no previous anticoagulant or antiplatelet                            agents. ASA Grade Assessment: II - A patient with                            mild systemic disease. After reviewing the risks                            and benefits, the patient was deemed in                      satisfactory condition to undergo the procedure.                           After obtaining informed consent, the endoscope was                            passed under direct vision. Throughout the                            procedure, the patient's blood pressure, pulse, and                            oxygen saturations were monitored continuously. The                            Endoscope was introduced through the mouth, with                            the intention of advancing to the duodenum. The                            scope was advanced to the duodenal bulb before the                            procedure was aborted. Medications were given. The                            upper GI endoscopy was technically difficult and                            complex due to the patient's cardiovascular                            instability. The patient tolerated the procedure                            fairly well. Scope In: Scope Out: Findings:      LA Grade D (one or more mucosal breaks involving at least 75% of  esophageal circumference) esophagitis with no bleeding was found in the       middle third of the esophagus.      Diffuse moderate inflammation characterized by congestion (edema),       erythema and granularity was found in the entire examined stomach.      Diffuse moderate mucosal changes characterized by congestion, erythema,       granularity and inflammation were found in the gastric fundus.      The duodenal bulb was normal.      Biopsy was not performed as procedure was aborted due to the patient's       cardiovascular instability ( Bradycardia with possible dropped QRS )      A small hiatal hernia was present. Impression:               - LA Grade D esophagitis.                           - Gastritis.                           - Congested, erythematous, granular and inflamed                            mucosa in the gastric fundus.                            - Normal duodenal bulb.                           - Small hiatal hernia.                           - No specimens collected. Moderate Sedation:      Moderate (conscious) sedation was personally administered by an       anesthesia professional. The following parameters were monitored: oxygen       saturation, heart rate, blood pressure, and response to care. Recommendation:           - Return patient to hospital ward for ongoing care.                           - Full liquid diet.                           - Continue present medications.                           - Refer to a cardiologist.                           - Repeat upper endoscopy in 2 months to check                            healing.                           - Return to GI clinic in 4 weeks. Procedure Code(s):        --- Professional ---  43235, 52, Esophagogastroduodenoscopy, flexible,                            transoral; diagnostic, including collection of                            specimen(s) by brushing or washing, when performed                            (separate procedure) Diagnosis Code(s):        --- Professional ---                           K20.9, Esophagitis, unspecified                           K29.70, Gastritis, unspecified, without bleeding                           K31.89, Other diseases of stomach and duodenum                           K44.9, Diaphragmatic hernia without obstruction or                            gangrene                           D50.9, Iron deficiency anemia, unspecified                           R58, Hemorrhage, not elsewhere classified CPT copyright 2016 [employer]. All rights reserved. The codes documented in this report are preliminary and upon coder review may  be revised to meet current compliance requirements. Kathi Der, MD Kathi Der, MD 04/29/2017 2:17:46 PM Number of Addenda: 0

## 2017-04-30 LAB — TYPE AND SCREEN
ABO/RH(D): A POS
Antibody Screen: NEGATIVE
Unit division: 0
Unit division: 0

## 2017-04-30 LAB — HEPATITIS PANEL, ACUTE
HCV Ab: <0.1 s/co ratio (ref 0.0–0.9)
Hep A IgM: NEGATIVE
Hep B C IgM: NEGATIVE
Hepatitis B Surface Ag: NEGATIVE

## 2017-04-30 LAB — FOLATE RBC
FOLATE, RBC: 1366 ng/mL (ref >498)
Folate, Hemolysate: 389.4 ng/mL
Hematocrit: 28.5 % — ABNORMAL LOW (ref 37.5–51.0)

## 2017-04-30 LAB — BPAM RBC
Blood Product Expiration Date: 201806032359
Blood Product Expiration Date: 201806032359
ISSUE DATE / TIME: 201805202037
ISSUE DATE / TIME: 201805210022
UNIT TYPE AND RH: 6200
Unit Type and Rh: 6200

## 2017-04-30 LAB — CBC
HCT: 27.1 % — ABNORMAL LOW (ref 39.0–52.0)
Hemoglobin: 8.8 g/dL — ABNORMAL LOW (ref 13.0–17.0)
MCH: 27.4 pg (ref 26.0–34.0)
MCHC: 32.5 g/dL (ref 30.0–36.0)
MCV: 84.4 fL (ref 78.0–100.0)
PLATELETS: 650 10*3/uL — AB (ref 150–400)
RBC: 3.21 MIL/uL — AB (ref 4.22–5.81)
RDW: 16.9 % — AB (ref 11.5–15.5)
WBC: 11.6 10*3/uL — ABNORMAL HIGH (ref 4.0–10.5)

## 2017-04-30 MED ORDER — OMEPRAZOLE 40 MG PO CPDR: 40.0000 mg | DELAYED_RELEASE_CAPSULE | Freq: Two times a day (BID) | ORAL | 1 refills | Status: AC
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
## 2017-04-30 NOTE — Progress Notes (Signed)
Pt discharge instructions and prescription given.  Pt verbalized understanding.  VSS. Denies pain.  Pt left floor ambulating accompanied by family.

## 2017-04-30 NOTE — Progress Notes (Signed)
[facility] Progress Note  Lorenda Cahillhomas D Circle 51 y.o. 07/21/1966  CC:  Severe iron deficiency anemia   Subjective: No acute GI issues. Denied nausea or vomiting. No bowel movement today. Complaining of some acid reflux.  ROS : Negative for fever. Negative for shortness of breath.   Objective: Vital signs in last 24 hours: Vitals:   04/30/17 0654 04/30/17 0850  BP: (!) 114/57   Pulse: 68 60  Resp: 16 18  Temp: 98.7 F (37.1 C)     Physical Exam:  Constitutional: He is oriented to person, place, and time and well-developed, well-nourished, and in no distress. No distress.  HENT:  Head: Normocephalic and atraumatic.  Nose: Nose normal.  Mouth/Throat: Oropharynx is clear and moist.  Eyes: Conjunctivae and EOM are normal. Left eye exhibits no discharge. No scleral icterus.  Neck: Normal range of motion. Neck supple. No thyromegaly present.  Cardiovascular: Normal rate, regular rhythm and normal heart sounds.   No murmur heard. Pulmonary/Chest: Effort normal. No respiratory distress. He has wheezes.  Abdominal: Soft. Bowel sounds are normal. He exhibits no distension. There is no tenderness. There is no rebound and no guarding.  Musculoskeletal: No extremity edema improving.    Lab Results:  Recent Labs  04/28/17 1748  NA 131*  K 3.8  CL 97*  CO2 22  GLUCOSE 115*  BUN 7  CREATININE 0.70  CALCIUM 7.7*    Recent Labs  04/28/17 1748  AST 16  ALT 8*  ALKPHOS 94  BILITOT 0.1*  PROT 5.7*  ALBUMIN 2.4*    Recent Labs  04/29/17 0547 04/29/17 1749  WBC 10.8* 11.3*  HGB 8.9* 10.7*  HCT 27.8* 33.6*  MCV 83.5 84.2  PLT 606* 734*   No results for input(s): LABPROT, INR in the last 72 hours.    Assessment/Plan: Severe iron deficiency anemia with hemoglobin of 6 on presentation. On and off dark colored stool as well as burgundy colored stool according to patient. - Significant NSAID use with 4 packets of [employer] powders almost every day. - History  of gastric ulcer in 2011. Biopsies negative for H. Pylori. - Bilateral lower Ext edema. Unknown etiology.- . Improving - Bradycardia during upper endoscopy. Normal follow up echocardiogram. Normal EKG.  Recommendations --------------------------- - EGD yesterday showed esophagitis and gastritis. EGD was aborted due to bradycardia/cardiac arrhythmia with drop in QRS complexes which could be from vagal stimulation. EKG as well as telemetry monitoring showed no further abnormality. Normal echo. No need for further cardiac workup according to cardiology. - If he continues to have  anemia, he may need repeat colonoscopy as an outpatient basis. - Avoid NSAIDs. Continue PPI. - Okay to discharge from GI standpoint. - Recommend follow-up in GI clinic in 4 weeks. He will need a repeat EGD to document healing esophagitis.   Kathi DerParag Alexismarie Flaim MD, FACP 04/30/2017, 9:29 AM  Pager [phone]  If no answer or after 5 PM call [phone]

## 2017-05-01 LAB — VITAMIN B1: VITAMIN B1 (THIAMINE): 138.2 nmol/L (ref 66.5–200.0)
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
## 2020-12-15 ENCOUNTER — Ambulatory Visit (INDEPENDENT_AMBULATORY_CARE_PROVIDER_SITE_OTHER): Payer: BC Managed Care – PPO | Admitting: Infectious Diseases

## 2020-12-15 ENCOUNTER — Encounter: Payer: Self-pay | Admitting: Infectious Diseases

## 2020-12-15 ENCOUNTER — Other Ambulatory Visit: Payer: Self-pay

## 2020-12-15 VITALS — BP 157/73 | HR 72 | Temp 97.7°F | Wt 98.6 lb

## 2020-12-15 DIAGNOSIS — J984 Other disorders of lung: Secondary | ICD-10-CM | POA: Diagnosis not present

## 2020-12-15 DIAGNOSIS — F17200 Nicotine dependence, unspecified, uncomplicated: Secondary | ICD-10-CM

## 2020-12-15 DIAGNOSIS — Z1159 Encounter for screening for other viral diseases: Secondary | ICD-10-CM | POA: Insufficient documentation

## 2020-12-15 DIAGNOSIS — A310 Pulmonary mycobacterial infection: Secondary | ICD-10-CM | POA: Diagnosis not present

## 2020-12-15 DIAGNOSIS — Z114 Encounter for screening for human immunodeficiency virus [HIV]: Secondary | ICD-10-CM

## 2020-12-15 DIAGNOSIS — B359 Dermatophytosis, unspecified: Secondary | ICD-10-CM

## 2020-12-15 MED ORDER — TERBINAFINE HCL 1 % EX CREA: 1.0000 "application " | TOPICAL_CREAM | Freq: Two times a day (BID) | CUTANEOUS | 0 refills | Status: DC

## 2020-12-15 NOTE — Assessment & Plan Note (Signed)
S/p prolonged IV pip/tazo Last CT chest in 11/30 although improvement in cavitation seen in the Rt upper and Left upper lobe, it has not completely resolved CT chest W contrast Fu with Pulmonology

## 2020-12-15 NOTE — Progress Notes (Signed)
[facility]                                                             [place], [place], [place], [address]                                                                  Phn. 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 E # Cavitary PNA ( Pseudomonas fluoroscens) - s/p Pip/tazo from 11/3-12/15/21   # COPD/Emphysema # Ex- smoker with extensive h/o smoking # Cachexia and PEM # Tinea Pedis/Tinea Ungum   Pertinent Labs 10/12/20 quantiferon negative  10/12/20 sputum afb smear and cx negative  10/12/20 Sputum cx Pseudomonas fluorescens  10/13/20 BAL AFB stain and smear cx negative 10/13/20 BAL fungal cx rare candida krusei and few candida dubliensis 10/13/20 BAL cytology negative for malignancy 10/15/20 AFB smear and sputum cx negative   Plan I have given 3 sputum cups to patient to collect his early morning sputum sample and bring it to the lab for AFB smear and cx. I have instructed him about how to collect it.  Although the Cavitary PNA has improved to some extent on most recent CT scan on 11/08/20, it has not completely resolved. Please see CT chest readings from 11/08/20. He will need a repeat imaging to make sure the RT and LT upper lobe cavitary PNA is resolved.  He says he already has a Pulmonary doctor with whom he follows up with and refused to get a Pulmonary referral  Given his h/o extensive smoking and cachectic exam, malignancy is a potential differential  Lamisil cream for fungal infection of finger nails and toe nails   HIV ab and HCV ab for completeness   Fu in 3 months   All questions and concerns were discussed and addressed. Patient  verbalized understanding of the plan. ____________________________________________________________________________________________________________________ HPI: Rickey Martin is a 55 Y O male with PMH of COPD with extensive h/o smoking and recent abstinence, Iron deficiency anemia, PEM, hypothyroidism, chronic pain of back and legs, GERD/Gastric ulcer, Pancreatitis, AAA s/p repair  who is referred for evaluation of CT chest findings concerning for MAC.   Of note, patient was recently admitted 10/12/2020-11/11/2020 at [facility] where he presented with cough. He was found to have RUL cavitary PNA. He was seen by ID/Pulmonolgy and Cardio thoracic sx. He was not felt to be a surgical candidate. TB was ruled out with negative AFB smear and cx from  BAL. .Broch with BAL grew Candida cruseii and dublinensis including Pseudomonas fluoroscens. He was initially started on Vanc and pip/tazo on 11/3 which was later de-escalated to Pip/tazo on 11/14 due to AKI, he was on Pip/tazo until 12/15.   Hospital course was complicated with AKI with Cr improving gradualky over the period of time. He also had Iron def anemia suspected due to GI blood loss. Stool testing was positive for blood. He was transfused PRBCs. EGD and colonoscopy on 11/30 showed sigmoid diverticulosis,  With internal hemorrhoids with no bleeding source found. Biopsy of the colon was benign  He was discharged from the hospital to [facility] on 12/3 and was recently discharged from the Rehab center.  He says he has quit smoking 2 months ago and before that, he has been smoking 1-2 packs a day for 350- 40 years.  Alcohol - beer 1 a day. Denies use illcit drug  Work in Secretary/administrator, Lives with girlfriend. Not sexually active. No pets   Denies fever, chills and sweats Does no complain of SOB to me. He says he can walk upto 2 blocks without any difficulty. Denies cough/chest pain Appetite is good, weight is  variable Nausea+  and takes zofran prn Always has chronic back pain/leg pain Denies rash, peripheral joint pain  ROS: Constitutional: Negative for fever, chills, activity change, appetite change, fatigue and unexpected weight change.  HENT: Negative for congestion, sore throat, rhinorrhea, sneezing, trouble swallowing and sinus pressure.  Eyes: Negative for photophobia and visual disturbance.  Respiratory: Negative for cough, chest tightness, shortness of breath, wheezing and stridor.  Cardiovascular: Negative for chest pain, palpitations and leg swelling.  Gastrointestinal: Negative for vomiting, abdominal pain, diarrhea, constipation, blood in stool, abdominal distention and anal bleeding. NAUSEA+ Genitourinary: Negative for dysuria, hematuria, flank pain and difficulty urinating.  Musculoskeletal: Negative for myalgias,  joint swelling, arthralgias and gait problem. BACK PAIN AND LEG PAIN+ Skin: Negative for color change, pallor, rash and wound.  Neurological: Negative for dizziness, tremors, weakness and light-headedness.  Hematological: Negative for adenopathy. Does not bruise/bleed easily.  Psychiatric/Behavioral: Negative for behavioral problems, confusion, sleep disturbance, dysphoric mood, decreased concentration and agitation.   Past Medical History:  Diagnosis Date  . AAA (abdominal aortic aneurysm) ([place])   . Anemia   . Blood transfusion without reported diagnosis   . COPD (chronic obstructive pulmonary disease) ([place])   . Diabetes mellitus without complication ([place])   . Gastric peptic ulcer   . GERD (gastroesophageal reflux disease)   . Heart valve disorder "leaky heart valve"  . History of blood transfusion    "w/stomach ulcer and today" (07/28/2015)  . Pancreatitis    Past Surgical History:  Procedure Laterality Date  . ABDOMINAL AORTIC ANEURYSM REPAIR  ~ 2009 X 2  . ABDOMINAL AORTIC ANEURYSM REPAIR     "put a stent in"  . COLONOSCOPY WITH PROPOFOL N/A 08/01/2015    Procedure: COLONOSCOPY WITH PROPOFOL;  Surgeon: Teena Irani, MD;  Location: [facility];  Service: Endoscopy;  Laterality: N/A;  . ESOPHAGOGASTRODUODENOSCOPY (EGD) WITH PROPOFOL N/A 07/29/2015   Procedure: ESOPHAGOGASTRODUODENOSCOPY (EGD) WITH PROPOFOL;  Surgeon: Wilford Corner, MD;  Location: [facility] ENDOSCOPY;  Service: Endoscopy;  Laterality: N/A;  . ESOPHAGOGASTRODUODENOSCOPY (EGD) WITH PROPOFOL N/A 04/29/2017   Procedure: ESOPHAGOGASTRODUODENOSCOPY (EGD) WITH PROPOFOL;  Surgeon: Otis Brace, MD;  Location: [facility] ENDOSCOPY;  Service: Gastroenterology;  Laterality: N/A;  . 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 albuterol (PROVENTIL HFA;VENTOLIN HFA) 108 (90 BASE) MCG/ACT inhaler Inhale 2 puffs into the lungs every 6 (six) hours as needed for wheezing or shortness of breath.    . ferrous sulfate 325 (65 FE) MG tablet Take 1 tablet (325 mg total) by mouth 2 (two) times daily with a meal. 60 tablet 0  . fluticasone furoate-vilanterol (BREO ELLIPTA) 100-25 MCG/INH AEPB Inhale 1 puff into the lungs daily.     Marland Kitchen levothyroxine (SYNTHROID, LEVOTHROID) 50 MCG tablet Take 50 mcg by mouth daily before breakfast.    . omeprazole (PRILOSEC) 40 MG capsule Take 1 capsule (40 mg total) by mouth 2 (two) times daily before a meal. 60 capsule 1  . ondansetron (ZOFRAN) 4 MG tablet Take 1 tablet (4 mg total) by mouth every 8 (eight) hours as needed for nausea or vomiting. 20 tablet 0  . oxyCODONE-acetaminophen (PERCOCET) 10-325 MG per tablet Take 1 tablet by mouth every 6 (six) hours as needed for pain.    . traZODone (DESYREL) 50 MG tablet Take 50 mg by mouth at bedtime.    . Vitamin D, Ergocalciferol, (DRISDOL) 50000 UNITS CAPS capsule Take 50,000 Units by mouth every Sunday.    . nicotine (NICODERM CQ - DOSED IN MG/24 HOURS) 21 mg/24hr patch Place 1 patch (21 mg total) onto the skin daily. (Patient not  taking: Reported on 12/15/2020) 28 patch 0   No current facility-administered medications on file prior to visit.   Allergies  Allergen Reactions  . Vancomycin Other (See Comments)    Acute renal failure-barely avoided hemodialysis   Social History   Socioeconomic History  . Marital status: Divorced    Spouse name: Not on file  . Number of children: Not on file  . Years of education: Not on file  . Highest education level: Not on file  Occupational History  . Not on file  Tobacco Use  . Smoking status: Former Smoker    Packs/day: 1.00    Years: 36.00    Pack years: 36.00  . Smokeless tobacco: Never Used  . Tobacco comment: quit 11/2020  Substance and Sexual Activity  . Alcohol use: Yes    Comment: 07/28/2015 "I drink a very little; not even enough to count"  . Drug use: No  . Sexual activity: Never  Other Topics Concern  . Not on file  Social History Narrative  . Not on file   Social Determinants of Health   Financial Resource Strain: Not on file  Food Insecurity: Not on file  Transportation Needs: Not on file  Physical Activity: Not on file  Stress: Not on file  Social Connections: Not on file  Intimate Partner Violence: Not on file     Vitals BP (!) 157/73   Pulse 72   Temp 97.7 F (36.5 C) (Oral)   Wt 98 lb 9.6 oz (44.7 kg)   BMI 14.99 kg/m    Examination  General - not in acute distress, comfortably sitting in chair, VERY THIN AND CACHECTIC HEENT - PEERLA, no pallor and no icterus Chest - b/l clear air entry, OCCASIONAL RALES + CVS- Normal s1s2, RRR Abdomen - Soft, Non tender , non distended Ext- no pedal edema, TINEA PEDIS AND TINEA UNGUM Neuro: grossly normal Back - WNL Psych : calm and cooperative   Recent labs CBC Latest Ref Rng & Units 04/30/2017 04/29/2017 04/29/2017  WBC 4.0 - 10.5 K/uL 11.6(H) 11.3(H) 10.8(H)  Hemoglobin 13.0 - 17.0 g/dL 8.8(L)  10.7(L) 8.9(L)  Hematocrit 39.0 - 52.0 % 27.1(L) 33.6(L) 27.8(L)  Platelets 150 - 400 K/uL  650(H) 734(H) 606(H)   CMP Latest Ref Rng & Units 04/28/2017 10/22/2015 08/02/2015  Glucose 65 - 99 mg/dL 115(H) 94 -  BUN 6 - 20 mg/dL 7 14 -  Creatinine 0.61 - 1.24 mg/dL 0.70 0.92 -  Sodium 135 - 145 mmol/L 131(L) 136 -  Potassium 3.5 - 5.1 mmol/L 3.8 3.7 3.5  Chloride 101 - 111 mmol/L 97(L) 97(L) -  CO2 22 - 32 mmol/L 22 18(L) -  Calcium 8.9 - 10.3 mg/dL 7.7(L) 8.9 -  Total Protein 6.5 - 8.1 g/dL 5.7(L) 7.6 -  Total Bilirubin 0.3 - 1.2 mg/dL 0.1(L) 1.0 -  Alkaline Phos 38 - 126 U/L 94 77 -  AST 15 - 41 U/L 16 15 -  ALT 17 - 63 U/L 8(L) 9(L) -     Pertinent Microbiology Results for orders placed or performed during the hospital encounter of 02/12/12  Urine culture     Status: None   Collection Time: 02/12/12  6:27 PM   Specimen: Urine, Clean Catch  Result Value Ref Range Status   Specimen Description URINE, CLEAN CATCH  Final   Special Requests NONE  Final   Culture  Setup Time [phone]  Final   Colony Count NO GROWTH  Final   Culture NO GROWTH  Final   Report Status 02/13/2012 FINAL  Final    Pertinent Imaging CT Chest abdomen pelvis 10/12/2020 FINDINGS:  Cardiovascular: Atherosclerosis of thoracic aorta is noted without  aneurysm or dissection. Normal cardiac size. No pericardial effusion  is noted. Coronary artery calcifications are noted.   Mediastinum/Nodes: The thyroid gland and esophagus are unremarkable.  Mildly enlarged mediastinal lymph nodes are noted, including 9 mm  precarinal lymph node. 8 mm lymph node is noted in aortopulmonary  window. 11 mm right paratracheal lymph node is noted. These most  likely are reactive or inflammatory in etiology.   Lungs/Pleura: No pneumothorax or pleural effusion is noted. Large  irregular cavitary abnormality is seen involving the right upper  lobe measuring 9.8 x 8.6 cm most consistent with cavitating  pneumonia. Multiple airspace opacities are also noted in the right  lower lobe. Cavitary abnormality measuring  4.3 x 4.0 cm is noted  laterally in the left upper lobe most consistent with cavitary  pneumonia. Multiple other smaller opacities are notedin the left  upper lobe most consistent with pneumonia. 3.3 x 2.7 cm opacity is  noted in left lung apex most consistent with pneumonia. Some  emphysematous disease is noted in the left upper lobe.   Upper Abdomen: Mild intrahepatic and extrahepatic biliary dilatation  is noted concerning for distal common bile duct obstruction.   Musculoskeletal: Severe compression deformity is seen involving  midthoracic vertebral body which most likely represents old  fracture. Severe compression deformity is seeninvolving lower  thoracic vertebral body which is unchanged compared to prior chest  radiograph of 2018 and most consistent with old fracture.   IMPRESSION:  1. Large irregular cavitary abnormality is seen involving the right  upper lobe measuring 9.8 x 8.6 cm most consistent with cavitating  pneumonia. Multiple other airspace opacities are noted in the right  lower lobe most consistent with pneumonia. Another cavitary  abnormality measuring 4.3 x 4.0 cm is noted laterally in the left  upper lobe. Tuberculosis or other atypical infection cannot be  excluded.  2. 3.3 x 2.7 cm opacity is noted in left lung apex  most consistent  with pneumonia.  3. Mildly enlarged mediastinal lymph nodes are noted which most  likely are reactive or inflammatory in etiology.  4. Coronary artery calcifications are noted suggesting coronary  artery disease.  5. Mild intrahepatic and extrahepatic biliary dilatation is noted  concerning for distal common bile duct obstruction. Correlation with  liver function tests is recommended.  6. Old compression fractures are noted in the thoracic spine.   Aortic Atherosclerosis (ICD10-I70.0) and Emphysema (ICD10-J43.9).   CT chest 11/08/2020 FINDINGS:  Cardiovascular: The heart is normal in size. No pericardial  effusion.  Stable mild tortuosity, ectasia and calcification of the  thoracic aorta. Stable coronary artery calcifications.   Mediastinum/Nodes: Scattered mediastinal and hilar lymph nodes are  likely reactive/inflammatory.   The esophagus is grossly normal.   Lungs/Pleura: Large complex cavitary process occupying the right  lung apex is slightly improved. The main cavitary lesion contains  less fluid and the adjacent loculated abscesses more mediallyand  inferolaterally have definitely improved.   On the prior study there or multifocal infiltrates in the right  lower lobe and these have largely resolved.   The complex cavitary infiltrate in the left upper lobe  laterally/peripherally also shows improvement. This measured  approximately 4.2 x 3.9 cm on the prior study and now measures 3.1 x  2.8 cm. The surrounding inflammatory process has improved. There  were several adjacent nodular airspace opacities on the prior study  and most of these haveeither resolved or significantly improved.   In the left lung apex there was a 3.2 x 2.6 cm rounded infiltrate.  This show slight improvement now measuring 2.0 x 1.9 cm with slight  central cavitation.   There is a persistent and perhaps slightly moreobvious patchy  tree-in-bud process in the lower lung zones which could be chronic  inflammation or atypical infection such as MAC. No pleural  effusions. No pulmonary edema or pneumothorax.   Upper Abdomen: No significant upper abdominal findings. Significant  artifact related to coils in the left upper quadrant. Stable  vascular calcifications.   Musculoskeletal: Stable thoracic and lumbar compression deformities.  No new compression fracture.   IMPRESSION:  1. Overall definite improved appearanceof the lungs when compared  to the prior study. Please see specific details as above.  2. Reactive/inflammatory adenopathy.  3. Patchy largely peripheral tree-in-bud type findings in the lower   lung zones suggesting chronic inflammation or atypical infection  such as MAC.  4. Stable thoracic and lumbar compression fractures.   Aortic Atherosclerosis (ICD10-I70.0) and Emphysema (ICD10-J43.9).    TTE 10/20/20 PROCEDURE  Image Quality: Fair.  -  SUMMARY  Left ventricular systolic function is normal.  LV ejection fraction = 55-60%.  There is mild to moderate mitral valve thickening.  There is trace mitral regurgitation.  There is trace tricuspid regurgitation.  No pulmonary hypertension.  There is no pericardial effusion.  There is no comparison study available.   -  FINDINGS:  LEFT VENTRICLE  The left ventricular size is normal. There is normal left ventricular  wall thickness. Left ventricular systolic function is normal. LV  ejection fraction = 55-60%. The left ventricular wall motion is  normal.   -  RIGHT VENTRICLE  The right ventricle is normal size. The right ventricular systolic  function is normal.   LEFT ATRIUM  The left atrial size is normal.   RIGHT ATRIUM  Right atrial size is normal.  -  AORTIC VALVE  Structurally normal aortic valve. There is no aortic stenosis.  There  is no aortic regurgitation.  -  MITRAL VALVE  There is mild to moderate mitral valve thickening. There is trace  mitral regurgitation.  -  TRICUSPID VALVE  Structurally normal tricuspid valve. There is trace tricuspid  regurgitation. No pulmonary hypertension. Estimated right ventricular  systolic pressure is 31 mmHg.  -  PULMONIC VALVE  The pulmonic valve is not well visualized.  -  ARTERIES  The aortic sinus is normal size.  -  VENOUS  IVC size was normal.  -  EFFUSION  There is no pericardial effusion.   All pertinent labs/Imagings/notes reviewed. All pertinent plain films and CT images have been personally visualized and interpreted; radiology reports have been reviewed. Decision making incorporated into the Impression / Recommendations.  I spent greater than  25 minutes with the patient including  review of prior medical records with greater than 50% of time in face to face counsel of the patient.    Electronically signed by:  Rosiland Oz, MD Infectious Disease Physician [facility] [address] [place], [place] [address] Phone: [phone]  Fax: [phone]

## 2020-12-15 NOTE — Assessment & Plan Note (Signed)
Lamisil 1% cream for tinea pedis and tinea ungum

## 2020-12-15 NOTE — Assessment & Plan Note (Signed)
Counseled on continued cessation of smoking

## 2020-12-15 NOTE — Assessment & Plan Note (Signed)
Willing to be screened for Hepatitis C

## 2020-12-15 NOTE — Assessment & Plan Note (Signed)
Willing to screen for HIV antibody

## 2020-12-16 ENCOUNTER — Other Ambulatory Visit: Payer: Self-pay | Admitting: Infectious Diseases

## 2020-12-16 ENCOUNTER — Other Ambulatory Visit: Payer: Self-pay

## 2020-12-16 ENCOUNTER — Telehealth: Payer: Self-pay

## 2020-12-16 DIAGNOSIS — J984 Other disorders of lung: Secondary | ICD-10-CM

## 2020-12-16 LAB — HEPATITIS C ANTIBODY
Hepatitis C Ab: NONREACTIVE
SIGNAL TO CUT-OFF: 0.07 (ref <1.00)

## 2020-12-16 LAB — HIV ANTIBODY (ROUTINE TESTING W REFLEX): HIV 1&2 Ab, 4th Generation: NONREACTIVE

## 2020-12-16 MED ORDER — TERBINAFINE HCL 1 % EX CREA: 1.0000 "application " | TOPICAL_CREAM | Freq: Two times a day (BID) | CUTANEOUS | 0 refills | Status: DC

## 2020-12-16 NOTE — Telephone Encounter (Signed)
OK. In that case, I will order a chest xray first 2 view then. Could you please have it scheduled?

## 2020-12-16 NOTE — Telephone Encounter (Signed)
Its ordered already, thanks

## 2020-12-16 NOTE — Telephone Encounter (Signed)
Insurance approval for CT scan was denied. To appeal, provider needs to call [phone] to speak with peer.   Levaughn Puccinelli [place] Mast, RN

## 2020-12-16 NOTE — Telephone Encounter (Signed)
Spoke with patient and informed him a chest x ray has been ordered for him to have done.  Patient does not need an appointment for the chest x ray, he can go as a walk in.  Patient stated he will try to go next week to have it done. Rickey Martin T Pricilla Loveless

## 2020-12-16 NOTE — Telephone Encounter (Signed)
Sure, Thank you.

## 2020-12-20 ENCOUNTER — Encounter: Payer: Self-pay | Admitting: Infectious Diseases

## 2020-12-20 NOTE — Progress Notes (Signed)
I called the insurance [phone]. Spoke with an agent who directed me to leave a voice mail with my name/call back number, diagnostic imaging concerned along with name/DOB and ID number of the patient. I left a voice mail with all above information along with my cell phone number for a call back.

## 2020-12-21 ENCOUNTER — Telehealth: Payer: Self-pay

## 2020-12-21 NOTE — Telephone Encounter (Signed)
RN called insurance company on behalf of Dr. Elinor Parkinson to request a physician call her back in regards to the peer to peer review. RN was advised that Dr. Leafy Half message from yesterday is on file and they will call her back, hopefully sometime today. RN left message with Dr. Leafy Half cell phone number, patient's name, date of birth and member ID number requesting call back.   Sandie Ano, RN

## 2020-12-21 NOTE — Telephone Encounter (Signed)
OK 

## 2020-12-21 NOTE — Telephone Encounter (Signed)
Received call from [place] at [facility]. She states that a peer to peer review will not be able to overturn the patient's denied request for a chest CT. She states there is already an open and approved request for a chest CT from Dr. Kathrynn Speed at [facility].   The patient has until 04/01/21 to complete the CT. The patient will need to have the CT done at [facility] and we will need to request access to the results. RN attempted to call Dr. Flossie Buffy office to schedule the CT, left message requesting call back.   Phone number for Dr. Kathrynn Speed: [phone]  Sandie Ano, RN

## 2020-12-21 NOTE — Telephone Encounter (Signed)
Spoke with medical assistant at Dr. Flossie Buffy office, [place]. RN explained that Dr. Elinor Parkinson needs to complete a chest CT, but that it will need to be done at [facility] for insurance purposes. RN left Dr. Leafy Half number for Dr. Kathrynn Speed in case he has questions regarding the order.   Sandie Ano, RN

## 2020-12-21 NOTE — Telephone Encounter (Signed)
RN left voicemail for Marchelle Folks [facility] referral coordinator, requesting call back in regards to patient's chest CT order.   Sandie Ano, RN

## 2020-12-21 NOTE — Telephone Encounter (Signed)
-----   Message from Odette Fraction, MD sent at 12/21/2020  9:22 AM EST ----- Regarding: CT chest I called yesterday for the peer to peer review of CT chest. They said to me the file is closed and directed me to leave a voice mail with my name/call back number, patients name, DOB etc. I left a voice mail with my cell phone number. I have not heard back yet  I am just writing this to you to follow up on this. I can talk with them for the peer to peer review.   Thanks.

## 2020-12-22 ENCOUNTER — Ambulatory Visit
Admission: RE | Admit: 2020-12-22 | Discharge: 2020-12-22 | Disposition: A | Discharge status: Home / Self Care | Payer: BC Managed Care – PPO | Source: Ambulatory Visit | Attending: Infectious Diseases | Admitting: Infectious Diseases

## 2021-01-02 NOTE — Telephone Encounter (Signed)
Thx

## 2021-01-02 NOTE — Telephone Encounter (Signed)
RN spoke with Rickey Martin at Dr. Flossie Buffy office. The patient is scheduled to see them on 01/12/21 at 3:45 pm. Rickey Martin will have Dr. Kathrynn Speed complete the patient's chest CT at this appointment and fax results to [facility] triage.   Sandie Ano, RN

## 2021-01-02 NOTE — Telephone Encounter (Signed)
Attempted to call patient to find out if he went for his chest CT, no answer. Left HIPAA compliant voicemail requesting callback.   Sandie Ano, RN

## 2021-01-02 NOTE — Telephone Encounter (Signed)
Could you please follow up if he went for the CT chest ?

## 2021-01-09 ENCOUNTER — Ambulatory Visit (HOSPITAL_COMMUNITY)
Admission: EM | Admit: 2021-01-09 | Discharge: 2021-01-09 | Disposition: A | Discharge status: Home / Self Care | Payer: BC Managed Care – PPO | Source: Home / Self Care

## 2021-01-09 ENCOUNTER — Other Ambulatory Visit: Payer: Self-pay

## 2021-01-09 ENCOUNTER — Encounter (HOSPITAL_COMMUNITY): Payer: Self-pay | Admitting: Emergency Medicine

## 2021-01-09 DIAGNOSIS — F4323 Adjustment disorder with mixed anxiety and depressed mood: Secondary | ICD-10-CM

## 2021-01-09 MED ORDER — HYDROXYZINE PAMOATE 25 MG PO CAPS: 25.0000 mg | ORAL_CAPSULE | Freq: Three times a day (TID) | ORAL | 0 refills | Status: DC | PRN

## 2021-01-09 MED ORDER — HYDROXYZINE PAMOATE 25 MG PO CAPS: 25.0000 mg | ORAL_CAPSULE | Freq: Three times a day (TID) | ORAL | 0 refills | Status: AC | PRN

## 2021-01-09 NOTE — ED Triage Notes (Signed)
Pt present to [facility] voluntarily via [employer] requesting for something to calm his nerves. Pt stated that his girlfriend broke up with him and his "nerves are shot." Pt is irritable and agitated. Pt tried to leave during triage stating" I don't think I will get the help that I need here" but changed his mind. Pt denies SI, HI and AVH at this time.

## 2021-01-09 NOTE — ED Provider Notes (Signed)
[facility] Medical Screening Exam  Patient Name: Rickey Martin MRN: [id number] Date of Evaluation: 01/09/21 Chief Complaint:   Diagnosis:  Final diagnoses:  Adjustment disorder with mixed anxiety and depressed mood    History of Present illness: Rickey Martin is a 55 y.o. male patient presented to [facility] as a walk in with complaints of worsening anxiety and needing medication that will help with anxiety.  Lorenda Cahill, 55 y.o., male patient seen face to face by this provider, consulted with Dr. Bronwen Betters; and chart reviewed on 01/09/21.  On evaluation Rickey Martin reports he is suppose to move out of girlfriends house in next week and has caused an increase in anxiety "I need a medication to help deal with anxiety or I'm going to have a mental breakdown."  Patient states he is not having any thoughts, intent, or plan to hurt or kill himself or anyone else.  Patient also denies psychosis, and paranoia.  Patient states he has no prior psychiatric history but would like to get set up for outpatient psychiatric services for medication management and therapy.  Patient also stating he would like resources for shelters and housing.   During evaluation Rickey Martin is sitting up right in chair in no acute distress.  He is alert, oriented x 4, calm and cooperative.  His mood is anxious with congruent affect.  He does not appear to be responding to internal/external stimuli or delusional thoughts.  Patient denies suicidal/self-harm/homicidal ideation, psychosis, and paranoia.  Patient answered question appropriately.   Discussed Vistaril also used for anxiety.  Informed of efficacy and side effects; written information also given.     Psychiatric Specialty Exam  Presentation  General Appearance:Appropriate for Environment; Casual  Eye Contact:Good  Speech:Clear and Coherent; Normal Rate  Speech Volume:Normal  Handedness:Right   Mood and Affect   Mood:Anxious  Affect:Congruent   Thought Process  Thought Processes:Goal Directed  Descriptions of Associations:Intact  Orientation:Full (Time, Place and Person)  Thought Content:WDL  Hallucinations:None  Ideas of Reference:None  Suicidal Thoughts:No  Homicidal Thoughts:No   Sensorium  Memory:Immediate Good; Recent Good  Judgment:Intact  Insight:Present   Executive Functions  Concentration:No data recorded Attention Span:Good  Recall:Good  Fund of Knowledge:Good  Language:Good   Psychomotor Activity  Psychomotor Activity:Normal   Assets  Assets:Communication Skills; Desire for Improvement; Housing; Social Support   Sleep  Sleep:Good  Number of hours: No data recorded  Physical Exam: Physical Exam Vitals and nursing note reviewed. Exam conducted with a chaperone present.  Constitutional:      General: He is not in acute distress.    Appearance: Normal appearance. He is not ill-appearing.  HENT:     Head: Normocephalic.  Eyes:     Pupils: Pupils are equal, round, and reactive to light.  Cardiovascular:     Rate and Rhythm: Normal rate.  Pulmonary:     Effort: Pulmonary effort is normal.  Musculoskeletal:        General: Normal range of motion.     Cervical back: Normal range of motion.  Skin:    General: Skin is warm and dry.  Neurological:     Mental Status: He is alert and oriented to person, place, and time.  Psychiatric:        Attention and Perception: Attention and perception normal. He does not perceive auditory or visual hallucinations.        Mood and Affect: Affect normal. Mood is anxious.  Speech: Speech normal.        Behavior: Behavior normal. Behavior is cooperative.        Thought Content: Thought content normal. Thought content is not paranoid. Thought content does not include homicidal or suicidal ideation.        Cognition and Memory: Cognition and memory normal.        Judgment: Judgment normal.    Review  of Systems  Constitutional: Negative.   HENT: Negative.   Eyes: Negative.   Respiratory: Negative.   Cardiovascular: Negative.   Gastrointestinal: Negative.   Genitourinary: Negative.   Musculoskeletal: Negative.   Skin: Negative.   Neurological: Negative.   Endo/Heme/Allergies: Negative.   Psychiatric/Behavioral: Negative for memory loss. Depression: Stable. Hallucinations: Denies. Substance abuse: Denies. Suicidal ideas: Denies. The patient does not have insomnia. Nervous/anxious: States anxiety has been worsening over the last week.        Patient states he and his girlfriend are splitting up and he will need to find a place to stay.  States this is the main cause of his anxiety.  States he came in to get prescription for medication that will help with his anxiety.     Blood pressure (!) 146/82, pulse 87, temperature 98 F (36.7 C), temperature source Oral, resp. rate 18, SpO2 100 %. There is no height or weight on file to calculate BMI.  Musculoskeletal: Strength & Muscle Tone: within normal limits Gait & Station: normal Patient leans: N/A   [facility] Discharge Disposition for Follow up and Recommendations: Based on my evaluation the patient does not appear to have an emergency medical condition and can be discharged with resources and follow up care in outpatient services for Medication Management and Individual Therapy  Patient given resources for outpatient psychiatric services and [facility] for 24 hour follow up intake Patient also given prescription for Vistaril 25 mg Tid prn anxiety 30 tablets    Follow-up Information    Call  [facility]   Why: Will get back to you by the next weekday for treatment issues. For emergencies, call our 24-hour line: [phone] [email or web address] Contact information: [address] [place] [address] [phone]               Assunta Found, NP 01/09/2021, 3:10 PM

## 2021-01-09 NOTE — Discharge Instructions (Signed)
Please follow up with [facility] [phone]) to assist with medication management and therapy.

## 2021-01-09 NOTE — ED Notes (Signed)
Patient A&O x 4, ambulatory. Patient discharged in no acute distress. Patient denied SI/HI, A/VH upon discharge. Patient verbalized understanding of all discharge instructions explained by staff, to include follow up appointments and safety plan. Patient escorted to lobby via staff for transport to destination. Safety maintained.  

## 2021-01-10 ENCOUNTER — Other Ambulatory Visit: Payer: Self-pay | Admitting: Infectious Diseases

## 2021-01-10 DIAGNOSIS — J984 Other disorders of lung: Secondary | ICD-10-CM

## 2021-02-21 ENCOUNTER — Telehealth: Payer: Self-pay

## 2021-02-21 NOTE — Telephone Encounter (Signed)
RN spoke to [place] at [facility] center. She states she will leave a message with Dr. Flossie Buffy nurse to call us back so that we can request patient's most recent chest CT be faxed to our office for Dr. Elinor Parkinson.   Sandie Ano, RN

## 2021-02-23 NOTE — Telephone Encounter (Signed)
RN spoke to [place] at [facility], she states she will leave a message for Dr. Flossie Buffy office requesting them to call us back.   Sandie Ano, RN

## 2021-03-15 ENCOUNTER — Ambulatory Visit: Payer: BC Managed Care – PPO | Admitting: Infectious Diseases

## 2021-03-23 ENCOUNTER — Ambulatory Visit: Payer: BC Managed Care – PPO | Admitting: Infectious Diseases

## 2021-04-09 DEATH — deceased
